# Patient Record
Sex: Female | Born: 1950 | Race: Black or African American | Hispanic: No | Smoking: Current every day smoker
Health system: Southern US, Community
[De-identification: ages and names within clinical notes are randomized; demographics above are authoritative.]

## PROBLEM LIST (undated history)

## (undated) DIAGNOSIS — K589 Irritable bowel syndrome without diarrhea: Secondary | ICD-10-CM

## (undated) DIAGNOSIS — G8929 Other chronic pain: Secondary | ICD-10-CM

## (undated) DIAGNOSIS — K219 Gastro-esophageal reflux disease without esophagitis: Secondary | ICD-10-CM

## (undated) DIAGNOSIS — J45909 Unspecified asthma, uncomplicated: Secondary | ICD-10-CM

## (undated) DIAGNOSIS — I509 Heart failure, unspecified: Secondary | ICD-10-CM

## (undated) DIAGNOSIS — K2970 Gastritis, unspecified, without bleeding: Secondary | ICD-10-CM

## (undated) DIAGNOSIS — E119 Type 2 diabetes mellitus without complications: Secondary | ICD-10-CM

## (undated) DIAGNOSIS — M1990 Unspecified osteoarthritis, unspecified site: Secondary | ICD-10-CM

## (undated) DIAGNOSIS — I10 Essential (primary) hypertension: Secondary | ICD-10-CM

## (undated) DIAGNOSIS — K449 Diaphragmatic hernia without obstruction or gangrene: Secondary | ICD-10-CM

## (undated) DIAGNOSIS — I219 Acute myocardial infarction, unspecified: Secondary | ICD-10-CM

## (undated) HISTORY — PX: COLON SURGERY: SHX602

## (undated) HISTORY — DX: Other chronic pain: G89.29

## (undated) HISTORY — PX: ANTERIOR AND POSTERIOR VAGINAL REPAIR: SUR5

## (undated) HISTORY — PX: KNEE SURGERY: SHX244

## (undated) HISTORY — DX: Essential (primary) hypertension: I10

## (undated) HISTORY — DX: Unspecified asthma, uncomplicated: J45.909

## (undated) HISTORY — PX: APPENDECTOMY: SHX54

## (undated) HISTORY — DX: Type 2 diabetes mellitus without complications: E11.9

## (undated) HISTORY — PX: FOOT SURGERY: SHX648

## (undated) HISTORY — PX: TUBAL LIGATION: SHX77

## (undated) HISTORY — DX: Heart failure, unspecified: I50.9

## (undated) HISTORY — PX: RIGHT COLECTOMY: SHX853

## (undated) HISTORY — DX: Unspecified osteoarthritis, unspecified site: M19.90

---

## 2008-02-25 ENCOUNTER — Emergency Department (HOSPITAL_COMMUNITY): Admission: EM | Admit: 2008-02-25 | Discharge: 2008-02-25 | Payer: Self-pay | Admitting: Emergency Medicine

## 2008-03-17 ENCOUNTER — Emergency Department (HOSPITAL_COMMUNITY): Admission: EM | Admit: 2008-03-17 | Discharge: 2008-03-18 | Payer: Self-pay | Admitting: Emergency Medicine

## 2008-03-30 ENCOUNTER — Emergency Department (HOSPITAL_COMMUNITY): Admission: EM | Admit: 2008-03-30 | Discharge: 2008-03-30 | Payer: Self-pay | Admitting: Emergency Medicine

## 2008-10-14 ENCOUNTER — Emergency Department (HOSPITAL_COMMUNITY): Admission: EM | Admit: 2008-10-14 | Discharge: 2008-10-14 | Payer: Self-pay | Admitting: Emergency Medicine

## 2009-05-01 ENCOUNTER — Emergency Department (HOSPITAL_COMMUNITY): Admission: EM | Admit: 2009-05-01 | Discharge: 2009-05-01 | Payer: Self-pay | Admitting: Family Medicine

## 2009-05-01 ENCOUNTER — Emergency Department (HOSPITAL_COMMUNITY): Admission: EM | Admit: 2009-05-01 | Discharge: 2009-05-01 | Payer: Self-pay | Admitting: Emergency Medicine

## 2010-02-21 ENCOUNTER — Emergency Department (HOSPITAL_COMMUNITY): Admission: EM | Admit: 2010-02-21 | Discharge: 2010-02-22 | Payer: Self-pay | Admitting: Emergency Medicine

## 2010-06-03 ENCOUNTER — Emergency Department (HOSPITAL_COMMUNITY): Admission: EM | Admit: 2010-06-03 | Discharge: 2010-06-03 | Payer: Self-pay | Admitting: Emergency Medicine

## 2011-02-04 LAB — DIFFERENTIAL
Basophils Absolute: 0 10*3/uL (ref 0.0–0.1)
Lymphocytes Relative: 41 % (ref 12–46)
Lymphs Abs: 2.6 10*3/uL (ref 0.7–4.0)
Monocytes Absolute: 0.5 10*3/uL (ref 0.1–1.0)
Monocytes Relative: 7 % (ref 3–12)
Neutro Abs: 3.1 10*3/uL (ref 1.7–7.7)

## 2011-02-04 LAB — COMPREHENSIVE METABOLIC PANEL
Alkaline Phosphatase: 60 U/L (ref 39–117)
BUN: 7 mg/dL (ref 6–23)
CO2: 27 mEq/L (ref 19–32)
Calcium: 9.6 mg/dL (ref 8.4–10.5)
Chloride: 106 mEq/L (ref 96–112)
GFR calc Af Amer: >60 mL/min (ref >60)
GFR calc non Af Amer: >60 mL/min (ref >60)
Glucose, Bld: 82 mg/dL (ref 70–99)
Potassium: 3.7 mEq/L (ref 3.5–5.1)
Total Bilirubin: 0.3 mg/dL (ref 0.3–1.2)
Total Protein: 7 g/dL (ref 6.0–8.3)

## 2011-02-04 LAB — URINALYSIS, ROUTINE W REFLEX MICROSCOPIC
Glucose, UA: NEGATIVE mg/dL
Ketones, ur: NEGATIVE mg/dL
Nitrite: NEGATIVE
Urobilinogen, UA: 0.2 mg/dL (ref 0.0–1.0)
pH: 6.5 (ref 5.0–8.0)

## 2011-02-04 LAB — CBC
Hemoglobin: 14.7 g/dL (ref 12.0–15.0)
RBC: 5.05 MIL/uL (ref 3.87–5.11)

## 2011-02-23 LAB — GLUCOSE, CAPILLARY: Glucose-Capillary: 118 mg/dL — ABNORMAL HIGH (ref 70–99)

## 2011-08-18 LAB — GLUCOSE, CAPILLARY: Glucose-Capillary: 125 — ABNORMAL HIGH

## 2011-08-21 ENCOUNTER — Emergency Department (HOSPITAL_COMMUNITY)
Admission: EM | Admit: 2011-08-21 | Discharge: 2011-08-21 | Disposition: A | Discharge status: Home / Self Care | Payer: Medicaid - Out of State | Attending: Emergency Medicine | Admitting: Emergency Medicine

## 2011-08-21 ENCOUNTER — Emergency Department (HOSPITAL_COMMUNITY): Payer: Medicaid - Out of State

## 2011-08-21 DIAGNOSIS — K7689 Other specified diseases of liver: Secondary | ICD-10-CM | POA: Insufficient documentation

## 2011-08-21 DIAGNOSIS — R109 Unspecified abdominal pain: Secondary | ICD-10-CM | POA: Insufficient documentation

## 2011-08-21 DIAGNOSIS — E119 Type 2 diabetes mellitus without complications: Secondary | ICD-10-CM | POA: Insufficient documentation

## 2011-08-21 DIAGNOSIS — F17200 Nicotine dependence, unspecified, uncomplicated: Secondary | ICD-10-CM | POA: Insufficient documentation

## 2011-08-21 DIAGNOSIS — Z9889 Other specified postprocedural states: Secondary | ICD-10-CM | POA: Insufficient documentation

## 2011-08-21 DIAGNOSIS — R10819 Abdominal tenderness, unspecified site: Secondary | ICD-10-CM | POA: Insufficient documentation

## 2011-08-21 DIAGNOSIS — I509 Heart failure, unspecified: Secondary | ICD-10-CM | POA: Insufficient documentation

## 2011-08-21 DIAGNOSIS — Z79899 Other long term (current) drug therapy: Secondary | ICD-10-CM | POA: Insufficient documentation

## 2011-08-21 LAB — CBC
HCT: 42 % (ref 36.0–46.0)
MCV: 85.2 fL (ref 78.0–100.0)
Platelets: 186 10*3/uL (ref 150–400)
RBC: 4.93 MIL/uL (ref 3.87–5.11)
RDW: 14.5 % (ref 11.5–15.5)

## 2011-08-21 LAB — URINALYSIS, ROUTINE W REFLEX MICROSCOPIC
Bilirubin Urine: NEGATIVE
Glucose, UA: NEGATIVE mg/dL
Hgb urine dipstick: NEGATIVE
Leukocytes, UA: NEGATIVE
Urobilinogen, UA: 1 mg/dL (ref 0.0–1.0)

## 2011-08-21 LAB — COMPREHENSIVE METABOLIC PANEL
ALT: 20 U/L (ref 0–35)
AST: 27 U/L (ref 0–37)
Albumin: 3.4 g/dL — ABNORMAL LOW (ref 3.5–5.2)
Alkaline Phosphatase: 57 U/L (ref 39–117)
BUN: 5 mg/dL — ABNORMAL LOW (ref 6–23)
CO2: 27 mEq/L (ref 19–32)
Calcium: 9.1 mg/dL (ref 8.4–10.5)
GFR calc Af Amer: >90 mL/min (ref >90)
Potassium: 3.1 mEq/L — ABNORMAL LOW (ref 3.5–5.1)
Total Bilirubin: 0.4 mg/dL (ref 0.3–1.2)
Total Protein: 6.7 g/dL (ref 6.0–8.3)

## 2011-08-21 LAB — DIFFERENTIAL
Basophils Relative: 0 % (ref 0–1)
Eosinophils Absolute: 0.1 10*3/uL (ref 0.0–0.7)
Monocytes Absolute: 0.5 10*3/uL (ref 0.1–1.0)
Neutro Abs: 1.4 10*3/uL — ABNORMAL LOW (ref 1.7–7.7)
Neutrophils Relative %: 32 % — ABNORMAL LOW (ref 43–77)

## 2011-08-21 LAB — LIPASE, BLOOD: Lipase: 24 U/L (ref 11–59)

## 2011-08-21 MED ORDER — IOHEXOL 300 MG/ML  SOLN
100.0000 mL | Freq: Once | INTRAMUSCULAR | Status: AC | PRN
  Administered 2011-08-21: 100 mL via INTRAVENOUS

## 2011-11-10 ENCOUNTER — Encounter: Payer: Self-pay | Admitting: *Deleted

## 2011-11-10 ENCOUNTER — Emergency Department (HOSPITAL_COMMUNITY): Payer: Medicaid - Out of State

## 2011-11-10 ENCOUNTER — Emergency Department (HOSPITAL_COMMUNITY)
Admission: EM | Admit: 2011-11-10 | Discharge: 2011-11-10 | Disposition: A | Discharge status: Home / Self Care | Payer: Medicaid - Out of State | Attending: Emergency Medicine | Admitting: Emergency Medicine

## 2011-11-10 DIAGNOSIS — J189 Pneumonia, unspecified organism: Secondary | ICD-10-CM | POA: Insufficient documentation

## 2011-11-10 DIAGNOSIS — R509 Fever, unspecified: Secondary | ICD-10-CM | POA: Insufficient documentation

## 2011-11-10 DIAGNOSIS — R0602 Shortness of breath: Secondary | ICD-10-CM | POA: Insufficient documentation

## 2011-11-10 DIAGNOSIS — R51 Headache: Secondary | ICD-10-CM | POA: Insufficient documentation

## 2011-11-10 DIAGNOSIS — R062 Wheezing: Secondary | ICD-10-CM | POA: Insufficient documentation

## 2011-11-10 HISTORY — DX: Diaphragmatic hernia without obstruction or gangrene: K44.9

## 2011-11-10 MED ORDER — ALBUTEROL SULFATE (5 MG/ML) 0.5% IN NEBU
5.0000 mg | INHALATION_SOLUTION | Freq: Once | RESPIRATORY_TRACT | Status: AC
  Administered 2011-11-10: 5 mg via RESPIRATORY_TRACT
  Filled 2011-11-10: qty 1

## 2011-11-10 MED ORDER — IPRATROPIUM BROMIDE 0.02 % IN SOLN
0.5000 mg | Freq: Once | RESPIRATORY_TRACT | Status: AC
  Administered 2011-11-10: 0.5 mg via RESPIRATORY_TRACT
  Filled 2011-11-10: qty 2.5

## 2011-11-10 MED ORDER — ONDANSETRON HCL 4 MG PO TABS: 4.0000 mg | ORAL_TABLET | Freq: Four times a day (QID) | ORAL | Status: DC

## 2011-11-10 MED ORDER — MOXIFLOXACIN HCL 400 MG PO TABS: 400.0000 mg | ORAL_TABLET | Freq: Every day | ORAL | Status: DC

## 2011-11-10 MED ORDER — OXYCODONE HCL 5 MG PO TABA: 1.0000 | ORAL_TABLET | Freq: Two times a day (BID) | ORAL | Status: DC | PRN

## 2011-11-10 NOTE — ED Provider Notes (Signed)
Medical screening examination/treatment/procedure(s) were performed by non-physician practitioner and as supervising physician I was immediately available for consultation/collaboration.  Toy Baker, MD 11/10/11 [phone]

## 2011-11-10 NOTE — ED Notes (Signed)
Pt reports having irritating fumes in apartment last few days and has had shob, asthma related beginning 2 days ago. Pt also reports HA.

## 2011-11-10 NOTE — ED Provider Notes (Signed)
History     CSN: [id number]  Arrival date & time 11/10/11  1317   First MD Initiated Contact with Patient 11/10/11 1336      Chief Complaint  Patient presents with  . Asthma  . Headache    (Consider location/radiation/quality/duration/timing/severity/associated sxs/prior treatment) HPI  Pt presents to the ED from her friends house for complaints of cough, headache and wheezing. Pt is here from [place] and has a primary care doctor name Dr. Beverly Sessions. The patient had an unknown surgery on her abdomen and was given Doxycline before coming to [place]. The patient states that Doxy gives her headaches, which she is currently complaining of, therefore she is not taking it anymore. The patient upon my entering to the room is coughing but not in respiratory distress.  Past Medical History  Diagnosis Date  . Asthma   . Hiatal hernia   . Diabetes mellitus     Past Surgical History  Procedure Date  . Right colectomy     No family history on file.  History  Substance Use Topics  . Smoking status: Current Everyday Smoker  . Smokeless tobacco: Not on file  . Alcohol Use: No    OB History    Grav Para Term Preterm Abortions TAB SAB Ect Mult Living                  Review of Systems  Allergies  Acetaminophen; Aspirin; Azithromycin; Benadryl; Dilaudid; Doxycycline; Fentanyl; Glimepiride; Glipizide; Levaquin; Meloxicam; Metformin and related; Morphine and related; Motrin; Penicillins; Prednisone; Shellfish allergy; Sulfa antibiotics; Tetracyclines & related; Vicodin; and Vioxx  Home Medications   Current Outpatient Rx  Name Route Sig Dispense Refill  . ALBUTEROL SULFATE HFA 108 (90 BASE) MCG/ACT IN AERS Inhalation Inhale 2 puffs into the lungs every 6 (six) hours as needed. For shortness of breath     . FLUTICASONE FUROATE 27.5 MCG/SPRAY NA SUSP Nasal Place 2 sprays into the nose daily.      Marland Kitchen MOXIFLOXACIN HCL 400 MG PO TABS Oral Take 1 tablet (400 mg total) by mouth daily. 10  tablet 0  . ONDANSETRON HCL 4 MG PO TABS Oral Take 1 tablet (4 mg total) by mouth every 6 (six) hours. 12 tablet 0  . OXYCODONE HCL (ABUSE DETER) 5 MG PO TABS Oral Take 1 tablet by mouth every 12 (twelve) hours as needed. 10 tablet 0    BP 147/70  Pulse 91  Temp(Src) 100.2 F (37.9 C) (Oral)  Resp 18  SpO2 99%  Physical Exam  Nursing note and vitals reviewed. Constitutional: She is oriented to person, place, and time. She appears well-developed and well-nourished.  HENT:  Head: Normocephalic and atraumatic.  Right Ear: External ear normal.  Left Ear: External ear normal.  Eyes: Conjunctivae are normal. Pupils are equal, round, and reactive to light.  Neck: Trachea normal, normal range of motion and full passive range of motion without pain. Neck supple.  Cardiovascular: Normal rate, regular rhythm and normal pulses.   Pulmonary/Chest: Effort normal. No respiratory distress. She has wheezes. She has rales. Chest wall is not dull to percussion. She exhibits no tenderness, no crepitus, no edema, no deformity and no retraction.  Abdominal: Soft. Normal appearance and bowel sounds are normal. There is no tenderness. There is no guarding.  Musculoskeletal: Normal range of motion.  Neurological: She is alert and oriented to person, place, and time. She has normal strength.  Skin: Skin is warm, dry and intact.  Psychiatric: She has a normal  mood and affect. Her speech is normal and behavior is normal. Judgment and thought content normal. Cognition and memory are normal.    ED Course  Procedures (including critical care time)  Labs Reviewed - No data to display Dg Chest 2 View  11/10/2011  *RADIOLOGY REPORT*  Clinical Data: Fever.  Cough.  Shortness of breath.  Chest pain.  CHEST - 2 VIEW  Comparison: 03/18/2008  Findings: Mild infiltrate is seen in the inferior right upper lobe, consistent with pneumonia.  Left lung is clear.  No evidence of pleural effusion.  Heart size is normal.   IMPRESSION: Mild inferior right upper lobe infiltrate, consistent with pneumonia.  Original Report Authenticated By: Danae Orleans, M.D.     1. Pneumonia       MDM  Pt is healthy, young, not tachypnic,tachycardic , is saturating well. She does have low grade fever. Pt chest xray positive for pneumonia. Pt has many medication allergies. She will be started on Avelox.        Dorthula Matas, PA 11/10/11 [phone]

## 2011-11-11 ENCOUNTER — Emergency Department (HOSPITAL_COMMUNITY)
Admission: EM | Admit: 2011-11-11 | Discharge: 2011-11-11 | Disposition: A | Discharge status: Home / Self Care | Payer: Medicaid - Out of State | Attending: Emergency Medicine | Admitting: Emergency Medicine

## 2011-11-11 ENCOUNTER — Encounter (HOSPITAL_COMMUNITY): Payer: Self-pay | Admitting: Emergency Medicine

## 2011-11-11 DIAGNOSIS — R062 Wheezing: Secondary | ICD-10-CM | POA: Insufficient documentation

## 2011-11-11 DIAGNOSIS — J189 Pneumonia, unspecified organism: Secondary | ICD-10-CM | POA: Insufficient documentation

## 2011-11-11 DIAGNOSIS — R0602 Shortness of breath: Secondary | ICD-10-CM | POA: Insufficient documentation

## 2011-11-11 MED ORDER — PANTOPRAZOLE SODIUM 40 MG PO TBEC
40.0000 mg | DELAYED_RELEASE_TABLET | Freq: Once | ORAL | Status: AC
  Administered 2011-11-11: 40 mg via ORAL
  Filled 2011-11-11: qty 1

## 2011-11-11 MED ORDER — ALBUTEROL SULFATE (5 MG/ML) 0.5% IN NEBU
5.0000 mg | INHALATION_SOLUTION | Freq: Once | RESPIRATORY_TRACT | Status: AC
  Administered 2011-11-11: 5 mg via RESPIRATORY_TRACT
  Filled 2011-11-11: qty 1

## 2011-11-11 MED ORDER — IPRATROPIUM BROMIDE 0.02 % IN SOLN
0.5000 mg | Freq: Once | RESPIRATORY_TRACT | Status: AC
  Administered 2011-11-11: 0.5 mg via RESPIRATORY_TRACT
  Filled 2011-11-11: qty 2.5

## 2011-11-11 MED ORDER — MOXIFLOXACIN HCL 400 MG PO TABS
400.0000 mg | ORAL_TABLET | Freq: Once | ORAL | Status: AC
  Administered 2011-11-11: 400 mg via ORAL
  Filled 2011-11-11: qty 1

## 2011-11-11 NOTE — ED Notes (Signed)
Pt states she was seen her yesterday and is unable to afford her medication. Pt states she needs to be seen for medication and flu

## 2011-11-11 NOTE — ED Provider Notes (Signed)
History     CSN: [id number]  Arrival date & time 11/11/11  1503   First MD Initiated Contact with Patient 11/11/11 1746      Chief Complaint  Patient presents with  . Medication Refill  . Influenza    (Consider location/radiation/quality/duration/timing/severity/associated sxs/prior treatment) HPI Comments: Patient was seen in the ED yesterday and was diagnosed with Pneumonia.  She was given a prescription for Avelox.  Patient is back today stating that she could not afford her prescription and could not get it filled.  She reports that she has had a productive cough for one and a half weeks, fever this morning, and shortness of breath.  She has been using Albuterol inhaler which she states helps with the shortness of breath.  PMH significant for asthma.  She currently smokes 1ppd.  Patient is a 60 y.o. female presenting with flu symptoms. The history is provided by the patient.  Influenza Associated symptoms include chills, congestion, fatigue, a fever and myalgias. Pertinent negatives include no chest pain, nausea, sore throat or vomiting.    Past Medical History  Diagnosis Date  . Asthma   . Hiatal hernia   . Diabetes mellitus     Past Surgical History  Procedure Date  . Right colectomy     No family history on file.  History  Substance Use Topics  . Smoking status: Current Everyday Smoker  . Smokeless tobacco: Not on file  . Alcohol Use: No    OB History    Grav Para Term Preterm Abortions TAB SAB Ect Mult Living                  Review of Systems  Constitutional: Positive for fever, chills and fatigue.  HENT: Positive for congestion. Negative for ear pain, sore throat and sinus pressure.   Respiratory: Positive for shortness of breath and wheezing. Negative for chest tightness.   Cardiovascular: Negative for chest pain.  Gastrointestinal: Negative for nausea and vomiting.  Musculoskeletal: Positive for myalgias.  Neurological: Negative for dizziness,  syncope and light-headedness.    Allergies  Acetaminophen; Aspirin; Azithromycin; Benadryl; Dilaudid; Doxycycline; Fentanyl; Glimepiride; Glipizide; Levaquin; Meloxicam; Metformin and related; Morphine and related; Motrin; Penicillins; Prednisone; Shellfish allergy; Sulfa antibiotics; Tetracyclines & related; Vicodin; and Vioxx  Home Medications   Current Outpatient Rx  Name Route Sig Dispense Refill  . ALBUTEROL SULFATE HFA 108 (90 BASE) MCG/ACT IN AERS Inhalation Inhale 2 puffs into the lungs every 6 (six) hours as needed. For shortness of breath     . FLUTICASONE FUROATE 27.5 MCG/SPRAY NA SUSP Nasal Place 2 sprays into the nose daily.        BP 125/77  Pulse 88  Temp 99.6 F (37.6 C)  Resp 22  SpO2 96%  Physical Exam  Constitutional: She is oriented to person, place, and time. She appears well-developed and well-nourished. No distress.  HENT:  Head: Normocephalic and atraumatic.  Right Ear: Tympanic membrane and ear canal normal.  Left Ear: Tympanic membrane and ear canal normal.  Nose: Nose normal.  Mouth/Throat: Uvula is midline, oropharynx is clear and moist and mucous membranes are normal.  Neck: Normal range of motion. Neck supple.  Cardiovascular: Normal rate, regular rhythm and normal heart sounds.   Pulmonary/Chest: Effort normal. No respiratory distress. She has wheezes.  Neurological: She is alert and oriented to person, place, and time.  Skin: Skin is warm and dry. No rash noted. She is not diaphoretic.  Psychiatric: She has a normal mood and  affect.    ED Course  Procedures (including critical care time)  Labs Reviewed - No data to display Dg Chest 2 View  11/10/2011  *RADIOLOGY REPORT*  Clinical Data: Fever.  Cough.  Shortness of breath.  Chest pain.  CHEST - 2 VIEW  Comparison: 03/18/2008  Findings: Mild infiltrate is seen in the inferior right upper lobe, consistent with pneumonia.  Left lung is clear.  No evidence of pleural effusion.  Heart size is  normal.  IMPRESSION: Mild inferior right upper lobe infiltrate, consistent with pneumonia.  Original Report Authenticated By: Danae Orleans, M.D.     No diagnosis found.  Patient given a breathing treatment.  Lungs CTAB, no wheezing after treatment.  Social work was consulted to help her get her medication filled.  Social work was able to find a pharmacy that the patient could go to to get Avelox that would be 265 dollars for her treatment.  She was unable to get any free antibiotics because she has insurance.  Patient was put on the Avelox because of her long list of allergies.  I went back to the room and asked patient about her allergies in an attempt to find a cheaper antibiotic.  Patient reports that she does not want any antibiotic that will upset her stomach and that she wants to keep her Avelox prescription.  She states that she will find a way to pay for it.  MDM  Patient diagnosed with Pneumonia yesterday.  She decided to continue current antibiotic.  Patient in no acute respiratory distress.        Pascal Lux [facility] 11/14/11 2020

## 2011-11-16 NOTE — ED Provider Notes (Signed)
Medical screening examination/treatment/procedure(s) were performed by non-physician practitioner and as supervising physician I was immediately available for consultation/collaboration.   Tsuyako Jolley, MD 11/16/11 1247 

## 2011-12-10 ENCOUNTER — Other Ambulatory Visit: Payer: Self-pay

## 2011-12-10 ENCOUNTER — Emergency Department (HOSPITAL_COMMUNITY)
Admission: EM | Admit: 2011-12-10 | Discharge: 2011-12-10 | Disposition: A | Discharge status: Home / Self Care | Payer: Medicaid - Out of State | Attending: Emergency Medicine | Admitting: Emergency Medicine

## 2011-12-10 ENCOUNTER — Encounter (HOSPITAL_COMMUNITY): Payer: Self-pay | Admitting: *Deleted

## 2011-12-10 ENCOUNTER — Emergency Department (HOSPITAL_COMMUNITY): Payer: Medicaid - Out of State

## 2011-12-10 DIAGNOSIS — E119 Type 2 diabetes mellitus without complications: Secondary | ICD-10-CM | POA: Insufficient documentation

## 2011-12-10 DIAGNOSIS — R112 Nausea with vomiting, unspecified: Secondary | ICD-10-CM | POA: Insufficient documentation

## 2011-12-10 DIAGNOSIS — H6091 Unspecified otitis externa, right ear: Secondary | ICD-10-CM

## 2011-12-10 DIAGNOSIS — R1013 Epigastric pain: Secondary | ICD-10-CM | POA: Insufficient documentation

## 2011-12-10 DIAGNOSIS — H60399 Other infective otitis externa, unspecified ear: Secondary | ICD-10-CM | POA: Insufficient documentation

## 2011-12-10 DIAGNOSIS — I252 Old myocardial infarction: Secondary | ICD-10-CM | POA: Insufficient documentation

## 2011-12-10 DIAGNOSIS — F411 Generalized anxiety disorder: Secondary | ICD-10-CM | POA: Insufficient documentation

## 2011-12-10 HISTORY — DX: Irritable bowel syndrome, unspecified: K58.9

## 2011-12-10 HISTORY — DX: Gastro-esophageal reflux disease without esophagitis: K21.9

## 2011-12-10 HISTORY — DX: Acute myocardial infarction, unspecified: I21.9

## 2011-12-10 HISTORY — DX: Gastritis, unspecified, without bleeding: K29.70

## 2011-12-10 LAB — LIPASE, BLOOD: Lipase: 24 U/L (ref 11–59)

## 2011-12-10 LAB — COMPREHENSIVE METABOLIC PANEL
Alkaline Phosphatase: 82 U/L (ref 39–117)
BUN: 6 mg/dL (ref 6–23)
CO2: 26 mEq/L (ref 19–32)
Chloride: 104 mEq/L (ref 96–112)
GFR calc Af Amer: >90 mL/min (ref >90)
GFR calc non Af Amer: >90 mL/min (ref >90)
Glucose, Bld: 133 mg/dL — ABNORMAL HIGH (ref 70–99)
Potassium: 3.7 mEq/L (ref 3.5–5.1)
Total Bilirubin: 0.5 mg/dL (ref 0.3–1.2)
Total Protein: 8.1 g/dL (ref 6.0–8.3)

## 2011-12-10 LAB — URINALYSIS, ROUTINE W REFLEX MICROSCOPIC
Bilirubin Urine: NEGATIVE
Ketones, ur: NEGATIVE mg/dL
Nitrite: NEGATIVE
Urobilinogen, UA: 1 mg/dL (ref 0.0–1.0)

## 2011-12-10 LAB — CBC
HCT: 44.8 % (ref 36.0–46.0)
Hemoglobin: 15.3 g/dL — ABNORMAL HIGH (ref 12.0–15.0)
MCHC: 34.2 g/dL (ref 30.0–36.0)
RBC: 5.41 MIL/uL — ABNORMAL HIGH (ref 3.87–5.11)

## 2011-12-10 LAB — POCT I-STAT TROPONIN I: Troponin i, poc: 0 ng/mL (ref 0.00–0.08)

## 2011-12-10 MED ORDER — SUCRALFATE 1 G PO TABS: 1.0000 g | ORAL_TABLET | Freq: Three times a day (TID) | ORAL | Status: DC

## 2011-12-10 MED ORDER — SUCRALFATE 1 G PO TABS
1.0000 g | ORAL_TABLET | Freq: Once | ORAL | Status: AC
  Administered 2011-12-10: 1 g via ORAL
  Filled 2011-12-10: qty 1

## 2011-12-10 MED ORDER — NEOMYCIN-POLYMYXIN-HC 3.5-10000-1 OT SOLN
4.0000 [drp] | Freq: Four times a day (QID) | OTIC | Status: DC
  Administered 2011-12-10: 4 [drp] via OTIC
  Filled 2011-12-10: qty 10

## 2011-12-10 MED ORDER — GI COCKTAIL ~~LOC~~
30.0000 mL | Freq: Once | ORAL | Status: AC
  Administered 2011-12-10: 30 mL via ORAL
  Filled 2011-12-10: qty 30

## 2011-12-10 NOTE — ED Provider Notes (Signed)
History     CSN: [id number]  Arrival date & time 12/10/11  0757   First MD Initiated Contact with Patient 12/10/11 0813      No chief complaint on file.    The history is provided by the patient.   the patient is a 61 year old female with a past medical history significant for an MI, diabetes, reflux, and recent colon resection in 06/2012 for a suspicious polyp who presents with burning epigastric pain present since she woke up yesterday morning. The pain is severe, constant, radiates up to the throat. It is associated with nausea and vomiting, as well as excessive belching. There has been no associated fever, chills, shortness of breath, arm pain abdominal pain, dysuria or change in bowel habit. The patient reports that she ate barbecued pork chops and spaghetti prior to the onset of symptoms. She has taken her Prilosec with minimal change in symptoms. There are no aggravating factors.  Past Medical History  Diagnosis Date  . Asthma   . Hiatal hernia   . Diabetes mellitus   . MI (myocardial infarction)   . GERD (gastroesophageal reflux disease)   . IBS (irritable bowel syndrome)   . Gastritis     Past Surgical History  Procedure Date  . Right colectomy     No family history on file.  History  Substance Use Topics  . Smoking status: Current Everyday Smoker  . Smokeless tobacco: Not on file  . Alcohol Use: No     Review of Systems 10 systems reviewed and are negative for acute change except as noted in the HPI.  Allergies  Acetaminophen; Aspirin; Azithromycin; Benadryl; Dilaudid; Doxycycline; Fentanyl; Glimepiride; Glipizide; Levaquin; Meloxicam; Metformin and related; Morphine and related; Motrin; Penicillins; Prednisone; Shellfish allergy; Sulfa antibiotics; Tetracyclines & related; Vicodin; and Vioxx  Home Medications   Current Outpatient Rx  Name Route Sig Dispense Refill  . ALBUTEROL SULFATE HFA 108 (90 BASE) MCG/ACT IN AERS Inhalation Inhale 2 puffs into the  lungs every 6 (six) hours as needed. For shortness of breath     . FLUTICASONE FUROATE 27.5 MCG/SPRAY NA SUSP Nasal Place 2 sprays into the nose daily.        BP 153/72  Pulse 65  Temp(Src) 99.1 F (37.3 C) (Oral)  Resp 18  Ht 5\' 7"  (1.702 m)  Wt 239 lb (108.41 kg)  BMI 37.43 kg/m2  SpO2 98%  Physical Exam  Nursing note and vitals reviewed. Constitutional: She is oriented to person, place, and time. She appears well-developed and well-nourished. She appears distressed.  HENT:  Head: Normocephalic and atraumatic.  Right Ear: Tympanic membrane and external ear normal.  Left Ear: Tympanic membrane and external ear normal.  Mouth/Throat: Oropharynx is clear and moist.       Erythema to right ear canal with pain on movement of the tragus.  Eyes: Conjunctivae are normal. Pupils are equal, round, and reactive to light.  Neck: Normal range of motion. Neck supple. No JVD present.  Cardiovascular: Normal rate, regular rhythm and normal heart sounds.   No murmur heard. Pulmonary/Chest: Effort normal and breath sounds normal. No respiratory distress. She has no wheezes. She exhibits no tenderness.  Abdominal: Soft. Bowel sounds are normal. She exhibits no distension and no mass. There is no rebound and no guarding.       Obese. Mild epigastric TTP  Musculoskeletal: She exhibits no edema and no tenderness.  Lymphadenopathy:    She has no cervical adenopathy.  Neurological: She is alert and  oriented to person, place, and time. No cranial nerve deficit.  Skin: Skin is warm and dry. No erythema.  Psychiatric:       Anxious    ED Course  Procedures (including critical care time)  Labs Reviewed  CBC - Abnormal; Notable for the following:    RBC 5.41 (*)    Hemoglobin 15.3 (*)    All other components within normal limits  COMPREHENSIVE METABOLIC PANEL - Abnormal; Notable for the following:    Glucose, Bld 133 (*)    All other components within normal limits  LIPASE, BLOOD  URINALYSIS,  ROUTINE W REFLEX MICROSCOPIC  POCT I-STAT TROPONIN I  I-STAT TROPONIN I   Dg Abd Acute W/chest  12/10/2011  *RADIOLOGY REPORT*  Clinical Data: Nausea, vomiting for 2 days, epigastric pain  ACUTE ABDOMEN SERIES (ABDOMEN 2 VIEW & CHEST 1 VIEW)  Comparison: CT abdomen pelvis of 08/21/2011 and portable chest x- ray of 03/18/2008  Findings: The lungs are clear.  Mediastinal contours appear stable. The heart is within normal limits in size.  No bony abnormality is seen.  Supine and erect views of the abdomen show no bowel obstruction. No free air is seen.  Calcifications along the right paravertebral region by plain film correlate with calcified lymph nodes when compared to the prior CT.  Surgical sutures are noted in the right abdomen.  IMPRESSION:  1.  No active lung disease. 2.  No bowel obstruction.  No free air.  Original Report Authenticated By: Juline Patch, M.D.    Date: 12/10/2011  Rate: 57  Rhythm: sinus  QRS Axis: normal  Intervals: normal  ST/T Wave abnormalities: normal  Conduction Disutrbances:none  Narrative Interpretation:   Old EKG Reviewed: unchanged    1. Epigastric pain   2. Otitis externa of right ear       MDM  8:40 AM Pt has been seen and evaluated. Symptoms most c/w reflux but atypical ACS cannot be ruled out clinically. ECG and Troponin ordered. Additional laboratory studies ordered to eval biliary or pancreatic pathology.  Will wait for ASA administration given low ACS suspicion and pt hx of intolerance.    9:00 AM GI cocktail ordered to attempt symptomatic relief.   11:30 AM All labs and x-rays have been reviewed. ECG unremarkable. Some relief with GI cocktail, but pt c/o continued symptoms. Carafate ordered. Pt also c/o ear itching. Exam c/w otitis externa. Abx drops ordered.           12:43 PM Pt reports significant symptom relief after carafate. Requests Rx for home.  Elwyn Reach [place], [place] 12/10/11 1243

## 2011-12-10 NOTE — ED Notes (Signed)
Pt states she had colon surgery in aug. Pt states she just started to have an appetite and started to eat a lot foods that are not good for her. Pt states she ate pork chops and drunk orange soda.pt states since she has started to feel heartburn and have some nausea.

## 2011-12-10 NOTE — ED Notes (Signed)
Pt upset she is still in ER.  Told pt I would see if doctor could speak with her.

## 2011-12-10 NOTE — ED Notes (Signed)
MD at bedside. 

## 2011-12-11 NOTE — ED Provider Notes (Signed)
Medical screening examination/treatment/procedure(s) were performed by non-physician practitioner and as supervising physician I was immediately available for consultation/collaboration.  Nhu Glasby, MD 12/11/11 0705 

## 2011-12-14 ENCOUNTER — Emergency Department (HOSPITAL_COMMUNITY)
Admission: EM | Admit: 2011-12-14 | Discharge: 2011-12-14 | Disposition: A | Discharge status: Home / Self Care | Payer: Medicaid - Out of State | Attending: Emergency Medicine | Admitting: Emergency Medicine

## 2011-12-14 ENCOUNTER — Encounter (HOSPITAL_COMMUNITY): Payer: Self-pay | Admitting: *Deleted

## 2011-12-14 DIAGNOSIS — I252 Old myocardial infarction: Secondary | ICD-10-CM | POA: Insufficient documentation

## 2011-12-14 DIAGNOSIS — L509 Urticaria, unspecified: Secondary | ICD-10-CM | POA: Insufficient documentation

## 2011-12-14 DIAGNOSIS — R21 Rash and other nonspecific skin eruption: Secondary | ICD-10-CM | POA: Insufficient documentation

## 2011-12-14 MED ORDER — FAMOTIDINE 20 MG PO TABS: 20.0000 mg | ORAL_TABLET | Freq: Two times a day (BID) | ORAL | Status: DC

## 2011-12-14 MED ORDER — FAMOTIDINE 20 MG PO TABS
20.0000 mg | ORAL_TABLET | Freq: Once | ORAL | Status: AC
  Administered 2011-12-14: 20 mg via ORAL
  Filled 2011-12-14: qty 1

## 2011-12-14 MED ORDER — DIPHENHYDRAMINE HCL 25 MG PO TABS: 25.0000 mg | ORAL_TABLET | Freq: Four times a day (QID) | ORAL | Status: AC

## 2011-12-14 MED ORDER — DIPHENHYDRAMINE HCL 25 MG PO CAPS
25.0000 mg | ORAL_CAPSULE | Freq: Once | ORAL | Status: AC
  Administered 2011-12-14: 25 mg via ORAL
  Filled 2011-12-14: qty 1

## 2011-12-14 MED ORDER — PREDNISONE 20 MG PO TABS
60.0000 mg | ORAL_TABLET | Freq: Once | ORAL | Status: AC
  Administered 2011-12-14: 60 mg via ORAL
  Filled 2011-12-14: qty 3

## 2011-12-14 NOTE — ED Notes (Signed)
Pt noted to be ambulating around the room. Pt went to bathroom with her purse. Pt is alert and oriented

## 2011-12-14 NOTE — ED Notes (Signed)
Pt states she took benadryl with other abx. Pt states she does not think she is allergic to benadryl

## 2011-12-14 NOTE — ED Notes (Signed)
Pt states she started to have itching on her finger and toe nails. Pt states she every time she takes her abx she breaks out in hives. Pt is alert and oriented. Pt does not appear to be in resp distress

## 2011-12-14 NOTE — ED Provider Notes (Signed)
History     CSN: [id number]  Arrival date & time 12/14/11  1020   First MD Initiated Contact with Patient 12/14/11 1029      No chief complaint on file.   (Consider location/radiation/quality/duration/timing/severity/associated sxs/prior treatment) Patient is a 61 y.o. female presenting with allergic reaction. The history is provided by the patient.  Allergic Reaction The primary symptoms are  rash and urticaria. The primary symptoms do not include wheezing, shortness of breath, cough, abdominal pain, nausea, vomiting, diarrhea, dizziness, palpitations or angioedema. The current episode started 13 to 24 hours ago. The problem has not changed since onset. The urticaria began 13 to 24 hours ago. The urticaria has been unchanged since its onset. Urticaria is located on the left foot, right foot, right hand, left hand, left arm and right arm.  The onset of the reaction was associated with a change in medication. Significant symptoms that are not present include eye redness.    Past Medical History  Diagnosis Date  . Asthma   . Hiatal hernia   . Diabetes mellitus   . MI (myocardial infarction)   . GERD (gastroesophageal reflux disease)   . IBS (irritable bowel syndrome)   . Gastritis     Past Surgical History  Procedure Date  . Right colectomy     No family history on file.  History  Substance Use Topics  . Smoking status: Current Everyday Smoker  . Smokeless tobacco: Not on file  . Alcohol Use: No    OB History    Grav Para Term Preterm Abortions TAB SAB Ect Mult Living                  Review of Systems  Constitutional: Negative for fever.  HENT: Negative for congestion and neck pain.   Eyes: Negative for redness.  Respiratory: Negative for cough, shortness of breath and wheezing.   Cardiovascular: Negative for palpitations.  Gastrointestinal: Negative for nausea, vomiting, abdominal pain and diarrhea.  Genitourinary: Negative for dysuria.  Musculoskeletal:  Negative for back pain.  Skin: Positive for rash.  Neurological: Negative for dizziness and headaches.  Hematological: Does not bruise/bleed easily.    Allergies  Acetaminophen; Aspirin; Azithromycin; Benadryl; Dairy aid; Dilaudid; Doxycycline; Fentanyl; Glimepiride; Glipizide; Levaquin; Meloxicam; Metformin and related; Morphine and related; Motrin; Penicillins; Prednisone; Shellfish allergy; Sulfa antibiotics; Tetracyclines & related; Vicodin; and Vioxx  Home Medications   Current Outpatient Rx  Name Route Sig Dispense Refill  . ALBUTEROL SULFATE HFA 108 (90 BASE) MCG/ACT IN AERS Inhalation Inhale 2 puffs into the lungs every 6 (six) hours as needed. For shortness of breath     . CALCIUM CARBONATE ANTACID 500 MG PO CHEW Oral Chew 2 tablets by mouth daily.    Marland Kitchen CLINDAMYCIN HCL 150 MG PO CAPS Oral Take 150 mg by mouth every 6 (six) hours. Take for 10 days.    Marland Kitchen FLUTICASONE FUROATE 27.5 MCG/SPRAY NA SUSP Nasal Place 2 sprays into the nose daily.      Marland Kitchen OMEPRAZOLE 20 MG PO CPDR Oral Take 20 mg by mouth daily.    Marland Kitchen RANITIDINE HCL 150 MG PO TABS Oral Take 150 mg by mouth 2 (two) times daily.    . SUCRALFATE 1 G PO TABS Oral Take 1 tablet (1 g total) by mouth 3 (three) times daily before meals. As needed 30 tablet 0  . DIPHENHYDRAMINE HCL 25 MG PO TABS Oral Take 1 tablet (25 mg total) by mouth every 6 (six) hours. 20 tablet 0  .  FAMOTIDINE 20 MG PO TABS Oral Take 1 tablet (20 mg total) by mouth 2 (two) times daily. 30 tablet 0    BP 122/69  Pulse 100  Temp(Src) 98.7 F (37.1 C) (Oral)  Resp 20  Ht 5\' 6"  (1.676 m)  Wt 240 lb (108.863 kg)  BMI 38.74 kg/m2  SpO2 97%  Physical Exam  Nursing note and vitals reviewed. Constitutional: She is oriented to person, place, and time. She appears well-developed and well-nourished. No distress.  HENT:  Head: Normocephalic and atraumatic.  Mouth/Throat: Oropharynx is clear and moist.  Eyes: Conjunctivae are normal. Pupils are equal, round, and  reactive to light.  Neck: Normal range of motion. Neck supple.  Cardiovascular: Normal rate, regular rhythm and normal heart sounds.   No murmur heard. Pulmonary/Chest: Effort normal and breath sounds normal. She has no wheezes.  Abdominal: Soft. Bowel sounds are normal. There is no tenderness.  Musculoskeletal: Normal range of motion.  Neurological: She is alert and oriented to person, place, and time. No cranial nerve deficit. She exhibits normal muscle tone. Coordination normal.  Skin: Rash noted.       Mild urticaria to bilateral forearms back of hands and tablet    ED Course  Procedures (including critical care time)  Labs Reviewed - No data to display No results found.   1. Hives       MDM   Patient seen frequently in the emergency department numerous times over the past 7 days. Currently no longer taking antibiotics was on 2 different antibiotics for pneumonia first it was Avelox and clindamycin. Patient presents with hives and itching of hands and feet no lip or tongue swelling no evidence of angioedema please related to the sucralfate. Will have patient stop that medication in the emergency department given 60 mg prednisone by mouth patient will not take a five-day course of prednisone. Patient doesn't like taking Benadryl does not have a true allergy but will agree to taking it for 24 hours given the Benadryl by mouth in the emergency department given prednisone 60 mg by mouth given Pepcid 20 mg by mouth will be sent home with a prescription for Pepcid to take for 7 days. This will substitute as well for the sucralfate.  The patient resident of [place] but has been in the [place] area now for a prolonged period of time resource guide provided so she can seek primary care provider here.        Shelda Jakes, MD 12/14/11 [phone]

## 2013-11-04 ENCOUNTER — Emergency Department (HOSPITAL_COMMUNITY): Payer: Medicaid Other

## 2013-11-04 ENCOUNTER — Emergency Department (HOSPITAL_COMMUNITY)
Admission: EM | Admit: 2013-11-04 | Discharge: 2013-11-04 | Disposition: A | Discharge status: Home / Self Care | Payer: Medicaid Other | Attending: Emergency Medicine | Admitting: Emergency Medicine

## 2013-11-04 ENCOUNTER — Encounter (HOSPITAL_COMMUNITY): Payer: Self-pay | Admitting: Emergency Medicine

## 2013-11-04 DIAGNOSIS — R110 Nausea: Secondary | ICD-10-CM

## 2013-11-04 DIAGNOSIS — Z794 Long term (current) use of insulin: Secondary | ICD-10-CM | POA: Insufficient documentation

## 2013-11-04 DIAGNOSIS — Z792 Long term (current) use of antibiotics: Secondary | ICD-10-CM | POA: Insufficient documentation

## 2013-11-04 DIAGNOSIS — K219 Gastro-esophageal reflux disease without esophagitis: Secondary | ICD-10-CM | POA: Insufficient documentation

## 2013-11-04 DIAGNOSIS — R42 Dizziness and giddiness: Secondary | ICD-10-CM | POA: Insufficient documentation

## 2013-11-04 DIAGNOSIS — N390 Urinary tract infection, site not specified: Secondary | ICD-10-CM | POA: Insufficient documentation

## 2013-11-04 DIAGNOSIS — R8271 Bacteriuria: Secondary | ICD-10-CM

## 2013-11-04 DIAGNOSIS — Z79899 Other long term (current) drug therapy: Secondary | ICD-10-CM | POA: Insufficient documentation

## 2013-11-04 DIAGNOSIS — Z880 Allergy status to penicillin: Secondary | ICD-10-CM | POA: Insufficient documentation

## 2013-11-04 DIAGNOSIS — R079 Chest pain, unspecified: Secondary | ICD-10-CM

## 2013-11-04 DIAGNOSIS — J45909 Unspecified asthma, uncomplicated: Secondary | ICD-10-CM | POA: Insufficient documentation

## 2013-11-04 DIAGNOSIS — E119 Type 2 diabetes mellitus without complications: Secondary | ICD-10-CM | POA: Insufficient documentation

## 2013-11-04 DIAGNOSIS — F17200 Nicotine dependence, unspecified, uncomplicated: Secondary | ICD-10-CM | POA: Insufficient documentation

## 2013-11-04 DIAGNOSIS — I252 Old myocardial infarction: Secondary | ICD-10-CM | POA: Insufficient documentation

## 2013-11-04 LAB — CBC WITH DIFFERENTIAL/PLATELET
Basophils Absolute: 0 10*3/uL (ref 0.0–0.1)
Basophils Relative: 0 % (ref 0–1)
Eosinophils Relative: 1 % (ref 0–5)
HCT: 43.2 % (ref 36.0–46.0)
Hemoglobin: 14.9 g/dL (ref 12.0–15.0)
Lymphocytes Relative: 43 % (ref 12–46)
MCHC: 34.5 g/dL (ref 30.0–36.0)
MCV: 84.9 fL (ref 78.0–100.0)
Monocytes Absolute: 0.4 10*3/uL (ref 0.1–1.0)
Monocytes Relative: 10 % (ref 3–12)
RDW: 13.6 % (ref 11.5–15.5)

## 2013-11-04 LAB — POCT I-STAT, CHEM 8
Chloride: 104 mEq/L (ref 96–112)
Creatinine, Ser: 0.7 mg/dL (ref 0.50–1.10)
Glucose, Bld: 188 mg/dL — ABNORMAL HIGH (ref 70–99)
HCT: 47 % — ABNORMAL HIGH (ref 36.0–46.0)
Potassium: 3.9 mEq/L (ref 3.5–5.1)
Sodium: 140 mEq/L (ref 135–145)

## 2013-11-04 LAB — URINALYSIS, ROUTINE W REFLEX MICROSCOPIC
Bilirubin Urine: NEGATIVE
Hgb urine dipstick: NEGATIVE
Ketones, ur: NEGATIVE mg/dL
Nitrite: NEGATIVE
pH: 6 (ref 5.0–8.0)

## 2013-11-04 LAB — POCT I-STAT TROPONIN I

## 2013-11-04 LAB — URINE MICROSCOPIC-ADD ON

## 2013-11-04 MED ORDER — ONDANSETRON HCL 4 MG/2ML IJ SOLN
4.0000 mg | Freq: Once | INTRAMUSCULAR | Status: AC
  Administered 2013-11-04: 4 mg via INTRAVENOUS
  Filled 2013-11-04: qty 2

## 2013-11-04 MED ORDER — OXYCODONE HCL 5 MG PO TABS: 5.0000 mg | ORAL_TABLET | ORAL | Status: DC | PRN

## 2013-11-04 MED ORDER — ONDANSETRON 8 MG PO TBDP: 8.0000 mg | ORAL_TABLET | Freq: Three times a day (TID) | ORAL | Status: DC | PRN

## 2013-11-04 MED ORDER — SODIUM CHLORIDE 0.9 % IV SOLN
INTRAVENOUS | Status: DC
  Administered 2013-11-04: 11:00:00 via INTRAVENOUS

## 2013-11-04 NOTE — ED Notes (Signed)
Patient will call when she has to urinate 

## 2013-11-04 NOTE — ED Notes (Signed)
Pt from home reports that she started taking Clindamycin yesterday for tooth infection, around 9 pm last night started to feel like her throat was blocked, then vomited. Pt states that she still feels dizzy this morning and her throat "feels swollen". Pt adds that she is also having burning in her stomach from abx. Pt is A&O and in NAD

## 2013-11-04 NOTE — ED Provider Notes (Signed)
CSN: [id number]     Arrival date & time 11/04/13  0920 History   First MD Initiated Contact with Patient 11/04/13 [phone]     Chief Complaint  Patient presents with  . Dizziness  . Headache  . Sore Throat   (Consider location/radiation/quality/duration/timing/severity/associated sxs/prior Treatment) Patient is a 62 y.o. female presenting with dizziness, headaches, and pharyngitis. The history is provided by the patient.  Dizziness Associated symptoms: headaches, nausea and vomiting   Associated symptoms: no chest pain, no diarrhea and no shortness of breath   Headache Associated symptoms: abdominal pain, dizziness, nausea, sore throat and vomiting   Associated symptoms: no back pain, no diarrhea, no ear pain, no pain, no neck pain, no neck stiffness and no numbness   Sore Throat Associated symptoms include abdominal pain and headaches. Pertinent negatives include no chest pain and no shortness of breath.   patient presents with multiple complaints. States she began to feel some chest pain and the feeling she was choking last night. She states she had some vomiting. She denies diarrhea. She states she felt as if something was making her oxygen go away. She also said a sore throat. She's had a throbbing headache. She's also had lower abdominal pain. She's had dental pain and has been on clindamycin" for her gums". She states she needs to see a oral Careers adviser. No fevers. Patient stated that she feels better and feels the same. No chest pain was dull. The headache is frontal and throbbing. No confusion. No lightheadedness. No dysuria.  Past Medical History  Diagnosis Date  . Asthma   . Hiatal hernia   . Diabetes mellitus   . MI (myocardial infarction)   . GERD (gastroesophageal reflux disease)   . IBS (irritable bowel syndrome)   . Gastritis    Past Surgical History  Procedure Laterality Date  . Right colectomy     No family history on file. History  Substance Use Topics  . Smoking  status: Current Every Day Smoker    Types: Cigarettes  . Smokeless tobacco: Not on file  . Alcohol Use: No   OB History   Grav Para Term Preterm Abortions TAB SAB Ect Mult Living                 Review of Systems  Constitutional: Negative for activity change and appetite change.  HENT: Positive for sore throat. Negative for ear pain.   Eyes: Negative for pain.  Respiratory: Negative for chest tightness and shortness of breath.   Cardiovascular: Negative for chest pain and leg swelling.  Gastrointestinal: Positive for nausea, vomiting and abdominal pain. Negative for diarrhea.  Genitourinary: Negative for flank pain.  Musculoskeletal: Negative for back pain, neck pain and neck stiffness.  Skin: Negative for rash.  Neurological: Positive for dizziness and headaches. Negative for weakness and numbness.  Psychiatric/Behavioral: Negative for behavioral problems.    Allergies  Acetaminophen; Aspirin; Azithromycin; Benadryl; Dairy aid; Dilaudid; Doxycycline; Fentanyl; Glimepiride; Glipizide; Levofloxacin; Meloxicam; Metformin and related; Morphine and related; Motrin; Penicillins; Prednisone; Shellfish allergy; Sulfa antibiotics; Tetracyclines & related; Vicodin; and Vioxx  Home Medications   Current Outpatient Rx  Name  Route  Sig  Dispense  Refill  . albuterol (PROVENTIL HFA;VENTOLIN HFA) 108 (90 BASE) MCG/ACT inhaler   Inhalation   Inhale 2 puffs into the lungs every 6 (six) hours as needed. For shortness of breath          . clindamycin (CLEOCIN) 150 MG capsule   Oral   Take 150  mg by mouth 3 (three) times daily.         Marland Kitchen gabapentin (NEURONTIN) 800 MG tablet   Oral   Take 800 mg by mouth 3 (three) times daily.         . insulin detemir (LEVEMIR) 100 UNIT/ML injection   Subcutaneous   Inject 18-24 Units into the skin at bedtime. Sliding scale         . lisinopril (PRINIVIL,ZESTRIL) 10 MG tablet   Oral   Take 10 mg by mouth daily.         . ranitidine (ZANTAC)  150 MG tablet   Oral   Take 150 mg by mouth 2 (two) times daily.         . simvastatin (ZOCOR) 40 MG tablet   Oral   Take 40 mg by mouth at bedtime.         . ondansetron (ZOFRAN-ODT) 8 MG disintegrating tablet   Oral   Take 1 tablet (8 mg total) by mouth every 8 (eight) hours as needed for nausea or vomiting.   10 tablet   0   . oxyCODONE (OXY IR/ROXICODONE) 5 MG immediate release tablet   Oral   Take 1 tablet (5 mg total) by mouth every 4 (four) hours as needed for moderate pain or severe pain.   6 tablet   0    BP 124/46  Pulse 56  Temp(Src) 98.5 F (36.9 C) (Oral)  Resp 16  SpO2 95% Physical Exam  Nursing note and vitals reviewed. Constitutional: She is oriented to person, place, and time. She appears well-developed and well-nourished.  HENT:  Head: Normocephalic and atraumatic.  Mouth/Throat: No oropharyngeal exudate.  Mild posterior pharyngeal erythema without exudate  Eyes: EOM are normal. Pupils are equal, round, and reactive to light.  Neck: Normal range of motion. Neck supple.  Cardiovascular: Normal rate, regular rhythm and normal heart sounds.   No murmur heard. Pulmonary/Chest: Effort normal and breath sounds normal. No respiratory distress. She has no wheezes. She has no rales.  Abdominal: Soft. Bowel sounds are normal. She exhibits no distension. There is no tenderness. There is no rebound and no guarding.  Musculoskeletal: Normal range of motion.  Neurological: She is alert and oriented to person, place, and time. No cranial nerve deficit.  Skin: Skin is warm and dry.  Psychiatric: She has a normal mood and affect. Her speech is normal.    ED Course  Procedures (including critical care time) Labs Review Labs Reviewed  URINALYSIS, ROUTINE W REFLEX MICROSCOPIC - Abnormal; Notable for the following:    Leukocytes, UA TRACE (*)    All other components within normal limits  URINE MICROSCOPIC-ADD ON - Abnormal; Notable for the following:     Bacteria, UA MANY (*)    All other components within normal limits  POCT I-STAT, CHEM 8 - Abnormal; Notable for the following:    BUN 5 (*)    Glucose, Bld 188 (*)    Hemoglobin 16.0 (*)    HCT 47.0 (*)    All other components within normal limits  CBC WITH DIFFERENTIAL  POCT I-STAT TROPONIN I   Imaging Review Dg Chest 2 View  11/04/2013   CLINICAL DATA:  Chest pain, vomiting.  EXAM: CHEST  2 VIEW  COMPARISON:  11/10/2011  FINDINGS: The heart size and mediastinal contours are within normal limits. Both lungs are clear. The visualized skeletal structures are unremarkable.  IMPRESSION: No active cardiopulmonary disease.   Electronically Signed   By:  Charlett Nose M.D.   On: 11/04/2013 10:33    EKG Interpretation    Date/Time:  Saturday November 04 2013 10:22:23 EST Ventricular Rate:  59 PR Interval:  154 QRS Duration: 48 QT Interval:  429 QTC Calculation: 425 R Axis:   37 Text Interpretation:  Sinus rhythm Low voltage, precordial leads Abnormal R-wave progression, early transition No significant change since last tracing Confirmed by Zakkary Thibault  MD, Fynn Vanblarcom ([id number]) on 11/04/2013 1:52:32 PM            MDM   1. Nausea   2. Chest pain   3. Bacteria in urine    Patient presents with multiple complaints. No nausea no tenderness headache abdominal pain vomiting cough she later added pain while she was trying to urinate but not after she urinated. Lab work is overall reassuring. There is bacteria in the urine without white cells. EKG reassuring. X-ray reassuring. Patient has numerous allergies and patient thinks this may result also to initiate. Doubt cardiac cause at this time. Urine culture pending. Will discharge with nausea medicines and some pain meds. Patient states he only thing she can take is oxycodone without Tylenol.    Juliet Rude. Rubin Payor, MD 11/04/13 1353

## 2013-11-06 ENCOUNTER — Encounter (INDEPENDENT_AMBULATORY_CARE_PROVIDER_SITE_OTHER): Payer: Self-pay | Admitting: Surgery

## 2013-11-13 ENCOUNTER — Encounter: Payer: Self-pay | Admitting: Advanced Practice Midwife

## 2013-11-20 ENCOUNTER — Encounter: Payer: Self-pay | Admitting: Obstetrics

## 2013-11-20 ENCOUNTER — Ambulatory Visit (INDEPENDENT_AMBULATORY_CARE_PROVIDER_SITE_OTHER): Payer: Medicaid Other | Admitting: Obstetrics

## 2013-11-20 VITALS — BP 121/80 | HR 89 | Temp 98.1°F | Wt 255.0 lb

## 2013-11-20 DIAGNOSIS — N949 Unspecified condition associated with female genital organs and menstrual cycle: Secondary | ICD-10-CM

## 2013-11-20 DIAGNOSIS — Z0000 Encounter for general adult medical examination without abnormal findings: Secondary | ICD-10-CM

## 2013-11-20 DIAGNOSIS — K5909 Other constipation: Secondary | ICD-10-CM

## 2013-11-20 DIAGNOSIS — Z1239 Encounter for other screening for malignant neoplasm of breast: Secondary | ICD-10-CM

## 2013-11-20 DIAGNOSIS — N951 Menopausal and female climacteric states: Secondary | ICD-10-CM | POA: Insufficient documentation

## 2013-11-20 DIAGNOSIS — R102 Pelvic and perineal pain: Secondary | ICD-10-CM

## 2013-11-20 DIAGNOSIS — R300 Dysuria: Secondary | ICD-10-CM | POA: Insufficient documentation

## 2013-11-20 MED ORDER — OXYCODONE HCL 10 MG PO TABS: 10.0000 mg | ORAL_TABLET | Freq: Four times a day (QID) | ORAL | Status: DC | PRN

## 2013-11-20 NOTE — Progress Notes (Signed)
Subjective:     Alexandra Werner is a 63 y.o. female here for a routine new patient exam.  Current complaints: Pt states that she was told that she [place] fibro[place] in [place].  Pt was seen at [facility] 2 weeks ago for pain.  Pt states that she does have lower abdominal and vaginal pain.  Pt states that her vaginal area gets swollen.  Pt states that she is also having some urinary problems.  Pt was told she had a [facility].  Pt states that she can't hold her urine as well anymore.  Personal health questionnaire reviewed: yes.  Also see chart Alexandra Werner Rachel previous visits under this chart.    Gynecologic History No LMP recorded. Patient is postmenopausal. Contraception: tubal ligation Last Pap: 2014. Results were: normal--was in [place] Last mammogram: Jan. 2014. Results were: due and would like to schedule  Obstetric History OB History  Gravida Para Term Preterm AB SAB TAB Ectopic Multiple Living  10 6 6  4 2 2   6     # Outcome Date GA Lbr Len/2nd Weight Sex Delivery Anes PTL Lv  10 TRM 02/07/81          9 TRM 06/02/78          8 TAB 1977          7 TRM 05/05/75          6 TAB 1975          5 SAB 1975          4 TRM 09/04/72          3 SAB 1972          2 TRM 08/05/70          1 TRM 09/01/68               The following portions of the patient's history were reviewed and updated as appropriate: allergies, current medications, past family history, past medical history, past social history, past surgical history and problem list.  Review of Systems Pertinent items are noted in HPI.    Objective:    General appearance: alert and no distress Breasts: normal appearance, no masses or tenderness Abdomen: normal findings: soft, non-tender Pelvic: cervix normal in appearance, external genitalia normal, no adnexal masses or tenderness, no cervical motion tenderness, rectovaginal septum normal, uterus normal size, shape, and consistency, vagina normal without  discharge and Suprapubic tenderness    Assessment:    Healthy female exam.   Suprapubic pain.  ? IC.  ? H/O fibroids.  ? IBS  Refer to Urology  Refer to GI   Plan:    Education reviewed: low fat, low cholesterol diet, self breast exams, smoking cessation and pain management. Mammogram ordered. Follow up in: 2 weeks. Ultrasound ordered.

## 2013-11-21 ENCOUNTER — Other Ambulatory Visit (HOSPITAL_COMMUNITY): Payer: Self-pay

## 2013-11-21 ENCOUNTER — Ambulatory Visit (INDEPENDENT_AMBULATORY_CARE_PROVIDER_SITE_OTHER): Payer: Medicaid Other | Admitting: Surgery

## 2013-11-21 ENCOUNTER — Encounter (HOSPITAL_COMMUNITY): Payer: Self-pay | Admitting: Emergency Medicine

## 2013-11-21 ENCOUNTER — Encounter (INDEPENDENT_AMBULATORY_CARE_PROVIDER_SITE_OTHER): Payer: Self-pay | Admitting: Surgery

## 2013-11-21 VITALS — BP 124/80 | HR 70 | Temp 98.6°F | Resp 16 | Ht 67.0 in | Wt 258.8 lb

## 2013-11-21 DIAGNOSIS — L729 Follicular cyst of the skin and subcutaneous tissue, unspecified: Secondary | ICD-10-CM

## 2013-11-21 DIAGNOSIS — L723 Sebaceous cyst: Secondary | ICD-10-CM

## 2013-11-21 LAB — GC/CHLAMYDIA PROBE AMP
CT PROBE, AMP APTIMA: NEGATIVE
GC Probe RNA: NEGATIVE

## 2013-11-21 LAB — PAP IG W/ RFLX HPV ASCU

## 2013-11-21 LAB — WET PREP BY MOLECULAR PROBE
CANDIDA SPECIES: NEGATIVE
GARDNERELLA VAGINALIS: NEGATIVE
Trichomonas vaginosis: NEGATIVE

## 2013-11-21 NOTE — Progress Notes (Signed)
Subjective:     Patient ID: Alexandra Werner, female   DOB: 08/30/1951, 63 y.o.   MRN: [id number]  HPI She is referred to me by Dr. Julio Sickssei Bonsu for evaluation of several soft tissue masses. These are on her left lateral neck and right wrist and hand. She is uncertain how long they have been. She is a poor historian. She has some complaints of the hand but none on the neck. She has multiple chronic medical issues.  Review of Systems     Objective:   Physical Exam  On exam,She has what appears to be several ganglion around the wrist and hand although these may represent lipomas. I cannot feel anything in the area of concern regarding her left neck/shoulder other than potential simple cysts    Assessment:     Left neck/shoulder cysts     Plan:     Regarding the hand, she would need referral to a hand specialist for this. Again these may represent ganglion. I suspect things on her neck and shoulder are totally benign. I am not recommending surgery at this point. I reassured her. I will see her as needed

## 2013-11-22 ENCOUNTER — Ambulatory Visit (HOSPITAL_COMMUNITY): Admission: RE | Admit: 2013-11-22 | Payer: Medicaid Other | Source: Ambulatory Visit

## 2013-11-22 ENCOUNTER — Ambulatory Visit (HOSPITAL_COMMUNITY)
Admission: RE | Admit: 2013-11-22 | Discharge: 2013-11-22 | Disposition: A | Discharge status: Home / Self Care | Payer: Medicaid Other | Source: Ambulatory Visit | Attending: Obstetrics | Admitting: Obstetrics

## 2013-11-22 DIAGNOSIS — N949 Unspecified condition associated with female genital organs and menstrual cycle: Secondary | ICD-10-CM | POA: Insufficient documentation

## 2013-11-22 DIAGNOSIS — R102 Pelvic and perineal pain: Secondary | ICD-10-CM

## 2013-11-22 DIAGNOSIS — Z0000 Encounter for general adult medical examination without abnormal findings: Secondary | ICD-10-CM

## 2013-11-22 LAB — URINE CULTURE
Colony Count: NO GROWTH
ORGANISM ID, BACTERIA: NO GROWTH

## 2013-11-24 ENCOUNTER — Encounter: Payer: Self-pay | Admitting: Obstetrics

## 2013-11-27 ENCOUNTER — Encounter: Payer: Self-pay | Admitting: Obstetrics

## 2013-11-27 ENCOUNTER — Ambulatory Visit (HOSPITAL_COMMUNITY)
Admission: RE | Admit: 2013-11-27 | Discharge: 2013-11-27 | Disposition: A | Discharge status: Home / Self Care | Payer: Medicaid Other | Source: Ambulatory Visit | Attending: Obstetrics | Admitting: Obstetrics

## 2013-11-27 DIAGNOSIS — Z1231 Encounter for screening mammogram for malignant neoplasm of breast: Secondary | ICD-10-CM | POA: Insufficient documentation

## 2013-11-27 DIAGNOSIS — Z1239 Encounter for other screening for malignant neoplasm of breast: Secondary | ICD-10-CM

## 2013-12-01 ENCOUNTER — Encounter (INDEPENDENT_AMBULATORY_CARE_PROVIDER_SITE_OTHER): Payer: Self-pay

## 2013-12-06 ENCOUNTER — Encounter: Payer: Self-pay | Admitting: Obstetrics

## 2013-12-06 ENCOUNTER — Ambulatory Visit (INDEPENDENT_AMBULATORY_CARE_PROVIDER_SITE_OTHER): Payer: Medicaid Other | Admitting: Obstetrics

## 2013-12-06 VITALS — BP 125/78 | HR 75 | Temp 98.7°F | Ht 67.0 in | Wt 257.0 lb

## 2013-12-06 DIAGNOSIS — R52 Pain, unspecified: Secondary | ICD-10-CM | POA: Insufficient documentation

## 2013-12-06 NOTE — Progress Notes (Signed)
Subjective:     Alexandra Werner is a 63 y.o. female here for a follow up for results.  Current complaints: systemic pain.  Personal health questionnaire reviewed: yes.   Gynecologic History No LMP recorded. Patient is postmenopausal. Contraception: post menopausal status Last Pap: 11/20/2013. Results were: normal Last mammogram: 2015. Results were: results pending  Obstetric History OB History  Gravida Para Term Preterm AB SAB TAB Ectopic Multiple Living  10 6 6  4 2 2   6     # Outcome Date GA Lbr Len/2nd Weight Sex Delivery Anes PTL Lv  10 TRM 02/07/81          9 TRM 06/02/78          8 TAB 1977          7 TRM 05/05/75          6 TAB 1975          5 SAB 1975          4 TRM 09/04/72          3 SAB 1972          2 TRM 08/05/70          1 TRM 09/01/68               The following portions of the patient's history were reviewed and updated as appropriate: allergies, current medications, past family history, past medical history, past social history, past surgical history and problem list.  Review of Systems Pertinent items are noted in HPI.    Objective:    No exam performed today, Consult only..    Assessment:    Chronic Pain.  Etiology is probably lumbar vertebrae.   Plan:    Education reviewed: Management of chronic pain.. Recommended further evaluation of back and possible referra by PMD to pain clinic.

## 2013-12-13 ENCOUNTER — Encounter: Payer: Self-pay | Admitting: Obstetrics

## 2014-01-11 ENCOUNTER — Emergency Department (HOSPITAL_COMMUNITY)
Admission: EM | Admit: 2014-01-11 | Discharge: 2014-01-11 | Disposition: A | Discharge status: Home / Self Care | Payer: Medicaid Other | Attending: Emergency Medicine | Admitting: Emergency Medicine

## 2014-01-11 ENCOUNTER — Encounter (HOSPITAL_COMMUNITY): Payer: Self-pay | Admitting: Emergency Medicine

## 2014-01-11 DIAGNOSIS — Z79899 Other long term (current) drug therapy: Secondary | ICD-10-CM | POA: Insufficient documentation

## 2014-01-11 DIAGNOSIS — J45909 Unspecified asthma, uncomplicated: Secondary | ICD-10-CM | POA: Insufficient documentation

## 2014-01-11 DIAGNOSIS — Z794 Long term (current) use of insulin: Secondary | ICD-10-CM | POA: Insufficient documentation

## 2014-01-11 DIAGNOSIS — I252 Old myocardial infarction: Secondary | ICD-10-CM | POA: Insufficient documentation

## 2014-01-11 DIAGNOSIS — R6884 Jaw pain: Secondary | ICD-10-CM | POA: Insufficient documentation

## 2014-01-11 DIAGNOSIS — F17200 Nicotine dependence, unspecified, uncomplicated: Secondary | ICD-10-CM | POA: Insufficient documentation

## 2014-01-11 DIAGNOSIS — K219 Gastro-esophageal reflux disease without esophagitis: Secondary | ICD-10-CM | POA: Insufficient documentation

## 2014-01-11 DIAGNOSIS — Z8739 Personal history of other diseases of the musculoskeletal system and connective tissue: Secondary | ICD-10-CM | POA: Insufficient documentation

## 2014-01-11 DIAGNOSIS — R0982 Postnasal drip: Secondary | ICD-10-CM | POA: Insufficient documentation

## 2014-01-11 DIAGNOSIS — J029 Acute pharyngitis, unspecified: Secondary | ICD-10-CM | POA: Insufficient documentation

## 2014-01-11 DIAGNOSIS — E119 Type 2 diabetes mellitus without complications: Secondary | ICD-10-CM | POA: Insufficient documentation

## 2014-01-11 DIAGNOSIS — K589 Irritable bowel syndrome without diarrhea: Secondary | ICD-10-CM | POA: Insufficient documentation

## 2014-01-11 DIAGNOSIS — Z880 Allergy status to penicillin: Secondary | ICD-10-CM | POA: Insufficient documentation

## 2014-01-11 DIAGNOSIS — I509 Heart failure, unspecified: Secondary | ICD-10-CM | POA: Insufficient documentation

## 2014-01-11 NOTE — Discharge Instructions (Signed)
May wish to use over the counter mucinex to help dry up nasal congestion and post-nasal drip. Air humidifier may also help with dryness in your nose. Follow up with ENT if you wish-- call their office to scheduled appt, otherwise you may follow-up with your primary care physician. Return to the ED for new concerns.

## 2014-01-11 NOTE — ED Provider Notes (Signed)
CSN: [id number]     Arrival date & time 01/11/14  1914 History  This chart was scribed for non-physician practitioner Sharilyn Sites, PA-C working with Raeford Razor, MD by Joaquin Music, ED Scribe. This patient was seen in room WTR7/WTR7 and the patient's care was started at 7:27 PM .   Chief Complaint  Patient presents with  . Sore Throat   The history is provided by the patient. No language interpreter was used.   HPI Comments: Alexandra Werner is a 63 y.o. female who presents to the Emergency Department complaining of ongoing congestion, cough, sore throat and L sided jaw pain with associated chills and cold sweats that began "months ago". She states she has been having a productive cough with sputum, nasal congestion, and states she has not been able to get adequate sleep due to symptoms. Pt states she has had "sweats" and states this has been occuring for the past 5 months. States she knows she is going through menopause and has hot flashes but is concerned sweats are due to something different.  Pt states she has been by PCP, dentist, health department without resolution of sx. She states she was recently seen by her dentist and states "she had an infection to her gums". Pt states she has been prescribed antibiotics for several months and states she has not had relief. Pt states she smoke cigarette daily. No chest pain, SOB, palpitations, dizziness, weakness.  Pt denies taking any OTC medications.  Pt requests to see ENT.  Past Medical History  Diagnosis Date  . Asthma   . Hiatal hernia   . Diabetes mellitus   . MI (myocardial infarction)   . GERD (gastroesophageal reflux disease)   . IBS (irritable bowel syndrome)   . Gastritis   . Arthritis   . Diabetes mellitus without complication   . CHF (congestive heart failure)   . Asthma    Past Surgical History  Procedure Laterality Date  . Right colectomy    . Knee surgery Left   . Foot surgery Right   . Tubal ligation    .  Colon surgery    . Appendectomy    . Anterior and posterior vaginal repair     Family History  Problem Relation Age of Onset  . Cancer Mother   . Stroke Father   . Stroke Sister   . Heart disease Brother    History  Substance Use Topics  . Smoking status: Current Every Day Smoker    Types: Cigarettes  . Smokeless tobacco: Not on file  . Alcohol Use: No   OB History   Grav Para Term Preterm Abortions TAB SAB Ect Mult Living   10 6 6  4 2 2   6      Review of Systems  HENT: Positive for congestion and sore throat.   Respiratory: Positive for cough.   All other systems reviewed and are negative.   Allergies  Acetaminophen; Aspirin; Azithromycin; Benadryl; Ciprofloxacin; Clindamycin/lincomycin; Dairy aid; Dilaudid; Doxycycline; Fentanyl; Glimepiride; Glipizide; Januvia; Levofloxacin; Meloxicam; Metformin and related; Morphine and related; Motrin; Moxifloxacin; Penicillins; Phenergan; Prednisone; Shellfish allergy; Sulfa antibiotics; Tetracyclines & related; Tetracyclines & related; Tylenol; Vicodin; and Vioxx  Home Medications   Current Outpatient Rx  Name  Route  Sig  Dispense  Refill  . albuterol (PROVENTIL HFA;VENTOLIN HFA) 108 (90 BASE) MCG/ACT inhaler   Inhalation   Inhale into the lungs every 6 (six) hours as needed for wheezing or shortness of breath.         Marland Kitchen  alprazolam (XANAX) 2 MG tablet   Oral   Take 2 mg by mouth at bedtime as needed for sleep.         . budesonide-formoterol (SYMBICORT) 80-4.5 MCG/ACT inhaler   Inhalation   Inhale 2 puffs into the lungs 2 (two) times daily.         Marland Kitchen buPROPion (WELLBUTRIN) 75 MG tablet   Oral   Take 75 mg by mouth 2 (two) times daily.         . clindamycin (CLEOCIN) 150 MG capsule   Oral   Take 150 mg by mouth 3 (three) times daily.         Marland Kitchen dicyclomine (BENTYL) 10 MG capsule   Oral   Take 10 mg by mouth 4 (four) times daily -  before meals and at bedtime.         . gabapentin (NEURONTIN) 800 MG  tablet   Oral   Take 800 mg by mouth 3 (three) times daily.         Marland Kitchen glyBURIDE (DIABETA) 5 MG tablet   Oral   Take 5 mg by mouth daily with breakfast.         . insulin detemir (LEVEMIR) 100 UNIT/ML injection   Subcutaneous   Inject 18-24 Units into the skin at bedtime. Sliding scale         . lidocaine (LIDODERM) 5 %   Transdermal   Place 2 patches onto the skin daily. Remove & Discard patch within 12 hours or as directed by MD         . lisinopril (PRINIVIL,ZESTRIL) 10 MG tablet   Oral   Take 10 mg by mouth daily.         . Methylcobalamin (B12-ACTIVE) 1 MG CHEW   Oral   Chew 1 tablet by mouth daily.         . nitroGLYCERIN (NITROSTAT) 0.4 MG SL tablet   Sublingual   Place 0.4 mg under the tongue every 5 (five) minutes as needed for chest pain.         Marland Kitchen Ophthalmic Irrigation Solution (EYE WASH) SOLN   Both Eyes   Place 1 drop into both eyes daily.         . ranitidine (ZANTAC) 150 MG capsule   Oral   Take 150 mg by mouth 2 (two) times daily.         . rosuvastatin (CRESTOR) 5 MG tablet   Oral   Take 5 mg by mouth daily.          BP 147/68  Pulse 74  Temp(Src) 98.5 F (36.9 C)  Resp 18  SpO2 100%  Physical Exam  Nursing note and vitals reviewed. Constitutional: She is oriented to person, place, and time. She appears well-developed and well-nourished. No distress.  HENT:  Head: Normocephalic and atraumatic.  Right Ear: Tympanic membrane and ear canal normal.  Left Ear: Tympanic membrane and ear canal normal.  Nose: Nose normal.  Mouth/Throat: Uvula is midline, oropharynx is clear and moist and mucous membranes are normal. No oropharyngeal exudate, posterior oropharyngeal edema, posterior oropharyngeal erythema or tonsillar abscesses.  Postnasal drip noted in oropharynx; tonsils normal in appearance bilaterally without exudate, uvula midline, no peritonsillar abscess, handling secretions appropriately; no difficulty swallowing or speaking;  airway patent  Eyes: Conjunctivae and EOM are normal. Pupils are equal, round, and reactive to light.  Neck: Normal range of motion. Neck supple.  Cardiovascular: Normal rate, regular rhythm and normal heart sounds.  Pulmonary/Chest: Effort normal and breath sounds normal. No respiratory distress. She has no wheezes.  Musculoskeletal: Normal range of motion.  Lymphadenopathy:    She has cervical adenopathy.  Neurological: She is alert and oriented to person, place, and time.  Skin: Skin is warm and dry. She is not diaphoretic.  Psychiatric: She has a normal mood and affect.    ED Course  Procedures DIAGNOSTIC STUDIES: Oxygen Saturation is 100% on RA, normal by my interpretation.    COORDINATION OF CARE: 7:37 PM-Discussed treatment plan which includes refer pt to HENT, advised pt to use humidifier and discharge pt with Mucinex and saline nasal spray. Advised pt to F/U with HENT. Pt agreed to plan.   Labs Review Labs Reviewed - No data to display Imaging Review No results found.  EKG Interpretation   None       MDM   Final diagnoses:  Post-nasal drip  Sore throat   HEENTexam negative with exception of PND in oropharynx and mild bilateral cervical lymphadeopathy.  Airway patent, no edema.  Pt speaking in full complete sentences without difficulty, no signs of respiratory distress.  Lungs without wheezes or rhonchi to suggest CAP.  Pt requests referral to ENT which was given.  Advised may use OTC mucinex to help with congestion. Discussed plan with pt, they agreed.  Return precautions advised.  I personally performed the services described in this documentation, which was scribed in my presence. The recorded information has been reviewed and is accurate.    Garlon HatchetLisa M Kreg Earhart, PA-C 01/11/14 2005

## 2014-01-11 NOTE — ED Notes (Signed)
Pt reports jaw and throat pain that started this afternoon. And says " I feel like my throat is closing up". Pt says that she felt this after eating a small piece of fish at [employer]. Pt nad. Skin warm and dry.

## 2014-01-15 NOTE — ED Provider Notes (Signed)
Medical screening examination/treatment/procedure(s) were performed by non-physician practitioner and as supervising physician I was immediately available for consultation/collaboration.   EKG Interpretation None       Raeford RazorStephen Jaysiah Marchetta, MD 01/15/14 1431

## 2014-03-09 ENCOUNTER — Emergency Department (HOSPITAL_COMMUNITY)
Admission: EM | Admit: 2014-03-09 | Discharge: 2014-03-09 | Disposition: A | Discharge status: Home / Self Care | Payer: Medicaid Other | Attending: Emergency Medicine | Admitting: Emergency Medicine

## 2014-03-09 ENCOUNTER — Encounter (HOSPITAL_COMMUNITY): Payer: Self-pay | Admitting: Emergency Medicine

## 2014-03-09 ENCOUNTER — Emergency Department (HOSPITAL_COMMUNITY): Payer: Medicaid Other

## 2014-03-09 DIAGNOSIS — K5900 Constipation, unspecified: Secondary | ICD-10-CM | POA: Insufficient documentation

## 2014-03-09 DIAGNOSIS — I509 Heart failure, unspecified: Secondary | ICD-10-CM | POA: Insufficient documentation

## 2014-03-09 DIAGNOSIS — Z8739 Personal history of other diseases of the musculoskeletal system and connective tissue: Secondary | ICD-10-CM | POA: Insufficient documentation

## 2014-03-09 DIAGNOSIS — B373 Candidiasis of vulva and vagina: Secondary | ICD-10-CM

## 2014-03-09 DIAGNOSIS — K589 Irritable bowel syndrome without diarrhea: Secondary | ICD-10-CM | POA: Insufficient documentation

## 2014-03-09 DIAGNOSIS — B3731 Acute candidiasis of vulva and vagina: Secondary | ICD-10-CM | POA: Insufficient documentation

## 2014-03-09 DIAGNOSIS — R739 Hyperglycemia, unspecified: Secondary | ICD-10-CM

## 2014-03-09 DIAGNOSIS — J069 Acute upper respiratory infection, unspecified: Secondary | ICD-10-CM

## 2014-03-09 DIAGNOSIS — I252 Old myocardial infarction: Secondary | ICD-10-CM | POA: Insufficient documentation

## 2014-03-09 DIAGNOSIS — J45901 Unspecified asthma with (acute) exacerbation: Secondary | ICD-10-CM | POA: Insufficient documentation

## 2014-03-09 DIAGNOSIS — E119 Type 2 diabetes mellitus without complications: Secondary | ICD-10-CM | POA: Insufficient documentation

## 2014-03-09 DIAGNOSIS — Z880 Allergy status to penicillin: Secondary | ICD-10-CM | POA: Insufficient documentation

## 2014-03-09 DIAGNOSIS — R0789 Other chest pain: Secondary | ICD-10-CM | POA: Insufficient documentation

## 2014-03-09 DIAGNOSIS — Z794 Long term (current) use of insulin: Secondary | ICD-10-CM | POA: Insufficient documentation

## 2014-03-09 DIAGNOSIS — K219 Gastro-esophageal reflux disease without esophagitis: Secondary | ICD-10-CM | POA: Insufficient documentation

## 2014-03-09 DIAGNOSIS — Z79899 Other long term (current) drug therapy: Secondary | ICD-10-CM | POA: Insufficient documentation

## 2014-03-09 DIAGNOSIS — F17200 Nicotine dependence, unspecified, uncomplicated: Secondary | ICD-10-CM | POA: Insufficient documentation

## 2014-03-09 LAB — URINALYSIS, ROUTINE W REFLEX MICROSCOPIC
Bilirubin Urine: NEGATIVE
Hgb urine dipstick: NEGATIVE
Ketones, ur: NEGATIVE mg/dL
LEUKOCYTES UA: NEGATIVE
NITRITE: NEGATIVE
Protein, ur: NEGATIVE mg/dL
SPECIFIC GRAVITY, URINE: 1.039 — AB (ref 1.005–1.030)
Urobilinogen, UA: 0.2 mg/dL (ref 0.0–1.0)
pH: 5 (ref 5.0–8.0)

## 2014-03-09 LAB — CBC
HEMATOCRIT: 45 % (ref 36.0–46.0)
Hemoglobin: 15.8 g/dL — ABNORMAL HIGH (ref 12.0–15.0)
MCH: 29.2 pg (ref 26.0–34.0)
MCHC: 35.1 g/dL (ref 30.0–36.0)
MCV: 83.2 fL (ref 78.0–100.0)
PLATELETS: 210 10*3/uL (ref 150–400)
RBC: 5.41 MIL/uL — ABNORMAL HIGH (ref 3.87–5.11)
RDW: 13.5 % (ref 11.5–15.5)
WBC: 5.8 10*3/uL (ref 4.0–10.5)

## 2014-03-09 LAB — BASIC METABOLIC PANEL
BUN: 9 mg/dL (ref 6–23)
CHLORIDE: 95 meq/L — AB (ref 96–112)
CO2: 23 mEq/L (ref 19–32)
Calcium: 9.7 mg/dL (ref 8.4–10.5)
Creatinine, Ser: 0.59 mg/dL (ref 0.50–1.10)
GFR calc non Af Amer: >90 mL/min (ref >90)
Glucose, Bld: 413 mg/dL — ABNORMAL HIGH (ref 70–99)
Potassium: 4 mEq/L (ref 3.7–5.3)
Sodium: 132 mEq/L — ABNORMAL LOW (ref 137–147)

## 2014-03-09 LAB — CBG MONITORING, ED: Glucose-Capillary: 371 mg/dL — ABNORMAL HIGH (ref 70–99)

## 2014-03-09 LAB — PRO B NATRIURETIC PEPTIDE

## 2014-03-09 LAB — I-STAT TROPONIN, ED: TROPONIN I, POC: 0 ng/mL (ref 0.00–0.08)

## 2014-03-09 LAB — URINE MICROSCOPIC-ADD ON

## 2014-03-09 MED ORDER — FLUCONAZOLE 200 MG PO TABS: 200.0000 mg | ORAL_TABLET | Freq: Every day | ORAL | Status: DC

## 2014-03-09 MED ORDER — INSULIN ASPART 100 UNIT/ML ~~LOC~~ SOLN
10.0000 [IU] | Freq: Once | SUBCUTANEOUS | Status: AC
  Administered 2014-03-09: 15:00:00 via SUBCUTANEOUS
  Filled 2014-03-09: qty 1

## 2014-03-09 NOTE — ED Provider Notes (Signed)
CSN: [id number]     Arrival date & time 03/09/14  1302 History   First MD Initiated Contact with Patient 03/09/14 1355     Chief Complaint  Patient presents with  . fatigue    . multiple complaints   . Vaginal Pain  . Chest Pain  . lung issues      (Consider location/radiation/quality/duration/timing/severity/associated sxs/prior Treatment) HPI Comments: Patient presents to the ER for evaluation of numerous complaints.   She reports that she has had Sinus drainage and congestion. Her doctor has evaluated her sinuses, however, and did not find anything wrong. She continues to have congestion and crusting of the nose.  She reports chronic and persistent Cough, bringing up "cold" which is brown colored. She feels Short of breath.  Used dish detergent instead of body wash, now vagina red and painful.  Her blood sugars have been running high. She has been taking all her medicines as usual. No vomiting or diarrhea. She does complain that she has felt Constipated, has been using laxatives her last one or 2 weeks.    Patient is a 63 y.o. female presenting with vaginal pain and chest pain.  Vaginal Pain Associated symptoms include chest pain and shortness of breath.  Chest Pain Associated symptoms: cough, fatigue and shortness of breath     Past Medical History  Diagnosis Date  . Asthma   . Hiatal hernia   . Diabetes mellitus   . MI (myocardial infarction)   . GERD (gastroesophageal reflux disease)   . IBS (irritable bowel syndrome)   . Gastritis   . Arthritis   . Diabetes mellitus without complication   . CHF (congestive heart failure)   . Asthma    Past Surgical History  Procedure Laterality Date  . Right colectomy    . Knee surgery Left   . Foot surgery Right   . Tubal ligation    . Colon surgery    . Appendectomy    . Anterior and posterior vaginal repair     Family History  Problem Relation Age of Onset  . Cancer Mother   . Stroke Father   . Stroke Sister   .  Heart disease Brother    History  Substance Use Topics  . Smoking status: Current Every Day Smoker    Types: Cigarettes  . Smokeless tobacco: Not on file  . Alcohol Use: No   OB History   Grav Para Term Preterm Abortions TAB SAB Ect Mult Living   10 6 6  4 2 2   6      Review of Systems  Constitutional: Positive for fatigue.  HENT: Positive for congestion.   Respiratory: Positive for cough and shortness of breath.   Cardiovascular: Positive for chest pain.  Gastrointestinal: Positive for constipation.  Genitourinary: Positive for vaginal pain.      Allergies  Acetaminophen; Aspirin; Azithromycin; Benadryl; Ciprofloxacin; Clindamycin/lincomycin; Dairy aid; Dilaudid; Doxycycline; Fentanyl; Glimepiride; Glipizide; Glyburide; Januvia; Levofloxacin; Meloxicam; Metformin and related; Morphine and related; Motrin; Moxifloxacin; Penicillins; Phenergan; Prednisone; Shellfish allergy; Sulfa antibiotics; Tetracyclines & related; Tetracyclines & related; Tylenol; Vicodin; and Vioxx  Home Medications   Prior to Admission medications   Medication Sig Start Date End Date Taking? Authorizing Provider  albuterol (PROVENTIL HFA;VENTOLIN HFA) 108 (90 BASE) MCG/ACT inhaler Inhale into the lungs every 6 (six) hours as needed for wheezing or shortness of breath.   Yes Historical Provider, MD  alprazolam Prudy Feeler(XANAX) 2 MG tablet Take 2 mg by mouth at bedtime as needed for sleep.  Yes Historical Provider, MD  budesonide-formoterol (SYMBICORT) 80-4.5 MCG/ACT inhaler Inhale 2 puffs into the lungs 2 (two) times daily.   Yes Historical Provider, MD  buPROPion (WELLBUTRIN) 75 MG tablet Take 75 mg by mouth 2 (two) times daily.   Yes Historical Provider, MD  dicyclomine (BENTYL) 10 MG capsule Take 10 mg by mouth 4 (four) times daily -  before meals and at bedtime.   Yes Historical Provider, MD  gabapentin (NEURONTIN) 800 MG tablet Take 800 mg by mouth 3 (three) times daily.   Yes Historical Provider, MD  insulin  detemir (LEVEMIR) 100 UNIT/ML injection Inject 18-24 Units into the skin at bedtime. Sliding scale   Yes Historical Provider, MD  lidocaine (LIDODERM) 5 % Place 2 patches onto the skin daily. Remove & Discard patch within 12 hours or as directed by MD   Yes Historical Provider, MD  lisinopril (PRINIVIL,ZESTRIL) 10 MG tablet Take 10 mg by mouth daily.   Yes Historical Provider, MD  Methylcobalamin (B12-ACTIVE) 1 MG CHEW Chew 1 tablet by mouth daily.   Yes Historical Provider, MD  nitroGLYCERIN (NITROSTAT) 0.4 MG SL tablet Place 0.4 mg under the tongue every 5 (five) minutes as needed for chest pain.   Yes Historical Provider, MD  ranitidine (ZANTAC) 150 MG capsule Take 150 mg by mouth 2 (two) times daily.   Yes Historical Provider, MD  rosuvastatin (CRESTOR) 5 MG tablet Take 5 mg by mouth daily.   Yes Historical Provider, MD   BP 126/70  Pulse 86  Temp(Src) 98.1 F (36.7 C) (Oral)  Resp 20  SpO2 99% Physical Exam  Constitutional: She is oriented to person, place, and time. She appears well-developed and well-nourished. No distress.  HENT:  Head: Normocephalic and atraumatic.  Right Ear: Hearing normal.  Left Ear: Hearing normal.  Nose: Nose normal.  Mouth/Throat: Oropharynx is clear and moist and mucous membranes are normal.  Eyes: Conjunctivae and EOM are normal. Pupils are equal, round, and reactive to light.  Neck: Normal range of motion. Neck supple.  Cardiovascular: Regular rhythm, S1 normal and S2 normal.  Exam reveals no gallop and no friction rub.   No murmur heard. Pulmonary/Chest: Effort normal and breath sounds normal. No respiratory distress. She exhibits no tenderness.  Abdominal: Soft. Normal appearance and bowel sounds are normal. There is no hepatosplenomegaly. There is no tenderness. There is no rebound, no guarding, no tenderness at McBurney's point and negative Murphy's sign. No hernia.  Musculoskeletal: Normal range of motion.  Neurological: She is alert and oriented  to person, place, and time. She has normal strength. No cranial nerve deficit or sensory deficit. Coordination normal. GCS eye subscore is 4. GCS verbal subscore is 5. GCS motor subscore is 6.  Skin: Skin is warm, dry and intact. No rash noted. No cyanosis.  Psychiatric: She has a normal mood and affect. Her speech is normal and behavior is normal. Thought content normal.    ED Course  Procedures (including critical care time) Labs Review Labs Reviewed  URINALYSIS, ROUTINE W REFLEX MICROSCOPIC - Abnormal; Notable for the following:    Specific Gravity, Urine 1.039 (*)    Glucose, UA >1000 (*)    All other components within normal limits  CBC - Abnormal; Notable for the following:    RBC 5.41 (*)    Hemoglobin 15.8 (*)    All other components within normal limits  BASIC METABOLIC PANEL - Abnormal; Notable for the following:    Sodium 132 (*)    Chloride  95 (*)    Glucose, Bld 413 (*)    All other components within normal limits  CBG MONITORING, ED - Abnormal; Notable for the following:    Glucose-Capillary 371 (*)    All other components within normal limits  PRO B NATRIURETIC PEPTIDE  URINE MICROSCOPIC-ADD ON  I-STAT TROPOININ, ED    Imaging Review No results found.   EKG Interpretation   Date/Time:  Friday March 09 2014 13:31:55 EDT Ventricular Rate:  94 PR Interval:  153 QRS Duration: 61 QT Interval:  341 QTC Calculation: 426 R Axis:   10 Text Interpretation:  Sinus rhythm Low voltage, precordial leads  Borderline T abnormalities, anterior leads Baseline wander in lead(s) V1  V2 V3 No significant change since last tracing Confirmed by Gwendolyne Welford  MD,  Linnette Panella [phone]) on 03/09/2014 2:09:11 PM      MDM   Final diagnoses:  URI (upper respiratory infection)  Hyperglycemia  Vaginal yeast infection    Patient presents to the ER with multiple complaints. She was really difficult to get the patient to focus on one complaint, as she essentially had a completely  positive review of systems. Despite this, however, she appears well. She has been experiencing cough and upper respiratory congestion. Her oxygen saturation is normal. She is afebrile. Chest x-ray is clear.  She had some complaints of chest discomfort, but it was mostly with cough. Her troponin is negative and EKG is unchanged from previous. I do not suspect acute coronary syndrome.  She does have nasal congestion on exam, but I do not suspect acute bacterial sinusitis at this time.  Patient is complaining of itching and burning in her vaginal region. This started after accidentally losing it was sewed to the body wash to wash the area. She is hyperglycemic. This is the only objective finding on her workup. She likely has some yeast contributing to the discomfort in the vaginal area. We'll treat with Diflucan.  Her hypoglycemia was treated with insulin here in the ER. She can followup with her primary care doctor for further management of her sugars continue to run high. She tells me they have been running high for some time. There is no evidence of DKA at this time.    Gilda Crease, MD 03/12/14 [phone]

## 2014-03-09 NOTE — Discharge Instructions (Signed)
Candidal Vulvovaginitis Candidal vulvovaginitis is an infection of the vagina and vulva. The vulva is the skin around the opening of the vagina. This may cause itching and discomfort in and around the vagina.  HOME CARE  Only take medicine as told by your doctor.  Do not have sex (intercourse) until the infection is healed or as told by your doctor.  Practice safe sex.  Tell your sex partner about your infection.  Do not douche or use tampons.  Wear cotton underwear. Do not wear tight pants or panty hose.  Eat yogurt. This may help treat and prevent yeast infections. GET HELP RIGHT AWAY IF:   You have a fever.  Your problems get worse during treatment or do not get better in 3 days.  You have discomfort, irritation, or itching in your vagina or vulva area.  You have pain after sex.  You start to get belly (abdominal) pain. MAKE SURE YOU:  Understand these instructions.  Will watch your condition.  Will get help right away if you are not doing well or get worse. Document Released: 01/29/2009 Document Revised: 01/25/2012 Document Reviewed: 01/29/2009 [facility] Patient Information 2014 [place], [place]. Hyperglycemia Hyperglycemia occurs when the glucose (sugar) in your blood is too high. Hyperglycemia can happen for many reasons, but it most often happens to people who do not know they have diabetes or are not managing their diabetes properly.  CAUSES  Whether you have diabetes or not, there are other causes of hyperglycemia. Hyperglycemia can occur when you have diabetes, but it can also occur in other situations that you might not be as aware of, such as: Diabetes  If you have diabetes and are having problems controlling your blood glucose, hyperglycemia could occur because of some of the following reasons:  Not following your meal plan.  Not taking your diabetes medications or not taking it properly.  Exercising less or doing less activity than you normally  do.  Being sick. Pre-diabetes  This cannot be ignored. Before people develop Type 2 diabetes, they almost always have "pre-diabetes." This is when your blood glucose levels are higher than normal, but not yet high enough to be diagnosed as diabetes. Research has shown that some long-term damage to the body, especially the heart and circulatory system, may already be occurring during pre-diabetes. If you take action to manage your blood glucose when you have pre-diabetes, you may delay or prevent Type 2 diabetes from developing. Stress  If you have diabetes, you may be "diet" controlled or on oral medications or insulin to control your diabetes. However, you may find that your blood glucose is higher than usual in the hospital whether you have diabetes or not. This is often referred to as "stress hyperglycemia." Stress can elevate your blood glucose. This happens because of hormones put out by the body during times of stress. If stress has been the cause of your high blood glucose, it can be followed regularly by your caregiver. That way he/she can make sure your hyperglycemia does not continue to get worse or progress to diabetes. Steroids  Steroids are medications that act on the infection fighting system (immune system) to block inflammation or infection. One side effect can be a rise in blood glucose. Most people can produce enough extra insulin to allow for this rise, but for those who cannot, steroids make blood glucose levels go even higher. It is not unusual for steroid treatments to "uncover" diabetes that is developing. It is not always possible to determine  if the hyperglycemia will go away after the steroids are stopped. A special blood test called an A1c is sometimes done to determine if your blood glucose was elevated before the steroids were started. SYMPTOMS  Thirsty.  Frequent urination.  Dry mouth.  Blurred vision.  Tired or fatigue.  Weakness.  Sleepy.  Tingling in feet  or leg. DIAGNOSIS  Diagnosis is made by monitoring blood glucose in one or all of the following ways:  A1c test. This is a chemical found in your blood.  Fingerstick blood glucose monitoring.  Laboratory results. TREATMENT  First, knowing the cause of the hyperglycemia is important before the hyperglycemia can be treated. Treatment may include, but is not be limited to:  Education.  Change or adjustment in medications.  Change or adjustment in meal plan.  Treatment for an illness, infection, etc.  More frequent blood glucose monitoring.  Change in exercise plan.  Decreasing or stopping steroids.  Lifestyle changes. HOME CARE INSTRUCTIONS   Test your blood glucose as directed.  Exercise regularly. Your caregiver will give you instructions about exercise. Pre-diabetes or diabetes which comes on with stress is helped by exercising.  Eat wholesome, balanced meals. Eat often and at regular, fixed times. Your caregiver or nutritionist will give you a meal plan to guide your sugar intake.  Being at an ideal weight is important. If needed, losing as little as 10 to 15 pounds may help improve blood glucose levels. SEEK MEDICAL CARE IF:   You have questions about medicine, activity, or diet.  You continue to have symptoms (problems such as increased thirst, urination, or weight gain). SEEK IMMEDIATE MEDICAL CARE IF:   You are vomiting or have diarrhea.  Your breath smells fruity.  You are breathing faster or slower.  You are very sleepy or incoherent.  You have numbness, tingling, or pain in your feet or hands.  You have chest pain.  Your symptoms get worse even though you have been following your caregiver's orders.  If you have any other questions or concerns. Document Released: 04/28/2001 Document Revised: 01/25/2012 Document Reviewed: 02/29/2012 [facility] Patient Information 2014 [place]Care, [place]LL[place].

## 2014-03-09 NOTE — ED Notes (Addendum)
Hx of MI, diabetes, CFH, bowel surgery and asthma.Pt has multiple complaints, current smoker, reports she has had a productive cough x1 year and wants to get her lung issues taken care of, reports SOB. Able to speak in full sentences. Reports she has not been able to get her blood sugar under control and wants help with that. Reports she also has a vaginal flare up x2 weeks, when she accidently got stool in her vaginal area, unsure if her body wash irritated vaginal area, also cleaned with dish soap. Reports central chest pain, hx of acid reflux, x1 week. Pain 5/10. Pt also reports legs have been swelling and mucus in her head/ sinus.

## 2014-05-29 ENCOUNTER — Encounter: Payer: Self-pay | Admitting: Obstetrics

## 2014-05-29 ENCOUNTER — Other Ambulatory Visit: Payer: Self-pay | Admitting: Obstetrics

## 2014-05-29 ENCOUNTER — Ambulatory Visit (INDEPENDENT_AMBULATORY_CARE_PROVIDER_SITE_OTHER): Payer: Medicaid Other | Admitting: Obstetrics

## 2014-05-29 VITALS — BP 134/82 | HR 67 | Temp 98.4°F | Ht 67.0 in | Wt 238.0 lb

## 2014-05-29 DIAGNOSIS — B49 Unspecified mycosis: Secondary | ICD-10-CM

## 2014-05-29 DIAGNOSIS — N6452 Nipple discharge: Secondary | ICD-10-CM

## 2014-05-29 DIAGNOSIS — L918 Other hypertrophic disorders of the skin: Secondary | ICD-10-CM

## 2014-05-29 DIAGNOSIS — L574 Cutis laxa senilis: Secondary | ICD-10-CM | POA: Insufficient documentation

## 2014-05-29 DIAGNOSIS — B3731 Acute candidiasis of vulva and vagina: Secondary | ICD-10-CM

## 2014-05-29 DIAGNOSIS — L908 Other atrophic disorders of skin: Secondary | ICD-10-CM

## 2014-05-29 DIAGNOSIS — N6459 Other signs and symptoms in breast: Secondary | ICD-10-CM

## 2014-05-29 DIAGNOSIS — B373 Candidiasis of vulva and vagina: Secondary | ICD-10-CM

## 2014-05-29 DIAGNOSIS — N644 Mastodynia: Secondary | ICD-10-CM

## 2014-05-29 MED ORDER — CLOTRIMAZOLE 1 % EX CREA: 1.0000 "application " | TOPICAL_CREAM | Freq: Two times a day (BID) | CUTANEOUS | Status: DC

## 2014-05-29 MED ORDER — FLUCONAZOLE 200 MG PO TABS: 200.0000 mg | ORAL_TABLET | ORAL | Status: DC

## 2014-05-29 NOTE — Progress Notes (Signed)
Patient ID: Alexandra Werner, female   DOB: 04/17/1951, 63 y.o.   MRN: [id number]  Chief Complaint  Patient presents with  . Problem    Breast Discharge/ Vaginal Discharge     HPI Alexandra Werner is a 63 y.o. female.  C/O tan nipple discharge after trauma to right breast ~ 2 months ago.  She also has bilateral breast tenderness.  Also c/o vaginal discharge and itching after taking antibiotics for the nipple discharge.  H/O colon cancer and treatments of surgery, chemo and radiation.  She had significant weight loss after treatment which left her with significant laxity of the skin, for which she would like Plastic Surgery consultation. HPI  Past Medical History  Diagnosis Date  . Asthma   . Hiatal hernia   . Diabetes mellitus   . MI (myocardial infarction)   . GERD (gastroesophageal reflux disease)   . IBS (irritable bowel syndrome)   . Gastritis   . Arthritis   . Diabetes mellitus without complication   . CHF (congestive heart failure)   . Asthma     Past Surgical History  Procedure Laterality Date  . Right colectomy    . Knee surgery Left   . Foot surgery Right   . Tubal ligation    . Colon surgery    . Appendectomy    . Anterior and posterior vaginal repair      Family History  Problem Relation Age of Onset  . Cancer Mother   . Stroke Father   . Stroke Sister   . Heart disease Brother   . Diabetes Son     Social History History  Substance Use Topics  . Smoking status: Current Every Day Smoker    Types: Cigarettes  . Smokeless tobacco: Never Used  . Alcohol Use: No    Allergies  Allergen Reactions  . Acetaminophen Nausea And Vomiting  . Aspirin Nausea And Vomiting and Other (See Comments)    Stomach bleeds  . Azithromycin Nausea And Vomiting and Other (See Comments)    Stomach bleeding  . Benadryl [Diphenhydramine Hcl] Itching and Other (See Comments)    Headache  . Ciprofloxacin Other (See Comments)    Upsets stomach / headache  . Dairy Aid [Lactase]  Other (See Comments)    Stomach upset  . Dilaudid [Hydromorphone Hcl] Swelling and Other (See Comments)    bruises   . Doxycycline Other (See Comments)    headache  . Fentanyl Hives and Other (See Comments)    Burned skin   . Glimepiride Other (See Comments)    Stomach upset  . Glipizide Hives and Other (See Comments)    Headaches, cramps  . Invokana [Canagliflozin]     Makes patient fall asleep and causes pain in her kidneys.   Alma Friendly. Januvia [Sitagliptin] Other (See Comments)    Severe headache   . Levofloxacin Itching and Other (See Comments)    Severe headache   . Meloxicam Other (See Comments)    hallucinations   . Metformin And Related Other (See Comments)    Upset stomach  . Morphine And Related Other (See Comments)    Give slowly or will get a headaahe  . Motrin [Ibuprofen] Nausea And Vomiting and Other (See Comments)    Stomach bleed  . Moxifloxacin Other (See Comments)    Headache, muscle cramps  . Penicillins Hives and Itching  . Phenergan [Promethazine Hcl] Other (See Comments)    headache  . Shellfish Allergy Itching and Swelling  . Sulfa Antibiotics  Other (See Comments)    Stomach upset  . Tetracyclines & Related Other (See Comments)    Stomach bleed, head ache  . Tetracyclines & Related Other (See Comments)    Upset stomach and headache   . Tylenol [Acetaminophen] Other (See Comments)    Headache   . Vicodin [Hydrocodone-Acetaminophen] Other (See Comments)    Messes with my kidneys, makes them burts  . Vioxx [Rofecoxib] Other (See Comments)    headache    Current Outpatient Prescriptions  Medication Sig Dispense Refill  . albuterol (PROVENTIL HFA;VENTOLIN HFA) 108 (90 BASE) MCG/ACT inhaler Inhale into the lungs every 6 (six) hours as needed for wheezing or shortness of breath.      . alprazolam (XANAX) 2 MG tablet Take 2 mg by mouth at bedtime as needed for sleep.      . budesonide-formoterol (SYMBICORT) 80-4.5 MCG/ACT inhaler Inhale 2 puffs into the lungs  2 (two) times daily.      Marland Kitchen lidocaine (LIDODERM) 5 % Place 2 patches onto the skin daily. Remove & Discard patch within 12 hours or as directed by MD      . lisinopril (PRINIVIL,ZESTRIL) 10 MG tablet Take 10 mg by mouth daily.      . nitroGLYCERIN (NITROSTAT) 0.4 MG SL tablet Place 0.4 mg under the tongue every 5 (five) minutes as needed for chest pain.      . ranitidine (ZANTAC) 150 MG capsule Take 150 mg by mouth 2 (two) times daily.      Marland Kitchen buPROPion (WELLBUTRIN) 75 MG tablet Take 75 mg by mouth 2 (two) times daily.      . clotrimazole (LOTRIMIN) 1 % cream Apply 1 application topically 2 (two) times daily.  60 g  2  . dicyclomine (BENTYL) 10 MG capsule Take 10 mg by mouth 4 (four) times daily -  before meals and at bedtime.      . fluconazole (DIFLUCAN) 200 MG tablet Take 1 tablet (200 mg total) by mouth every other day.  3 tablet  1  . rosuvastatin (CRESTOR) 5 MG tablet Take 5 mg by mouth daily.       No current facility-administered medications for this visit.    Review of Systems Review of Systems Constitutional: negative for fatigue.  Positive for weight loss. Respiratory: negative for cough and wheezing Cardiovascular: negative for chest pain, fatigue and palpitations Gastrointestinal: negative for abdominal pain and change in bowel habits Genitourinary:  Positive for vaginal discharge and itching. Integument/breast: positive for nipple discharge and laxity of skin in areas. Musculoskeletal:negative for myalgias Neurological: negative for gait problems and tremors Behavioral/Psych: negative for abusive relationship, depression Endocrine: negative for temperature intolerance     Blood pressure 134/82, pulse 67, temperature 98.4 F (36.9 C), height 5\' 7"  (1.702 m), weight 238 lb (107.956 kg).  Physical Exam Physical Exam General:   alert  Skin:   Laxity in areas.  Jock itch type rash.  Lungs:   clear to auscultation bilaterally  Heart:   regular rate and rhythm, S1, S2 normal,  no murmur, click, rub or gallop  Breasts:   normal without suspicious masses, skin or nipple changes or axillary nodes.  Positive tan colored nipple discharge.  Abdomen:  normal findings: no organomegaly, soft, non-tender and no hernia  Pelvis:  External genitalia:  Fungal Jock Itch type rash Urinary system: urethral meatus normal and bladder without fullness, nontender Vaginal: normal without tenderness, induration or masses.  Normal appearing discharge Cervix: normal appearance Adnexa: normal bimanual exam Uterus:  anteverted and non-tender, normal size      Data Reviewed Labs Notes  Assessment    Nipple discharge after trauma.  Bilateral breast tenderness. Candida vulvovaginitis and Jock Itch. Laxity of skin     Plan    [facility]erred to [facility] for evaluation of nipple discharge and breast tenderness  Clotrimazole cream and Diflucan Rx for Jock Itch and Candida Vulvovaginitis.  Referred to Plastic Surgery for evaluation of skin laxity.   Orders Placed This Encounter  Procedures  . WET PREP BY MOLECULAR PROBE  . MM Digital Diagnostic Bilat    CO-SIGN REQ  7/14    PF: 11/27/2013 [facility]    NO NEEDS   JB/ TRISHA    MEDICAID    Standing Status: Future     Number of Occurrences:      Standing Expiration Date: 07/31/2015    Order Specific Question:  Reason for Exam (SYMPTOM  OR DIAGNOSIS REQUIRED)    Answer:  Nipple discharge after trauma, right breast.  Tenderness, left breast.    Order Specific Question:  Is the patient pregnant?    Answer:  No    Order Specific Question:  Preferred imaging location?    Answer:  [facility]  . Ambulatory referral to Plastic Surgery    Referral Priority:  Routine    Referral Type:  Surgical    Referral Reason:  Specialty Services Required    Requested Specialty:  Plastic Surgery    Number of Visits Requested:  1   Meds ordered this encounter  Medications  . clotrimazole (LOTRIMIN) 1 % cream    Sig: Apply 1 application topically 2  (two) times daily.    Dispense:  60 g    Refill:  2  . fluconazole (DIFLUCAN) 200 MG tablet    Sig: Take 1 tablet (200 mg total) by mouth every other day.    Dispense:  3 tablet    Refill:  1        HARPER,CHARLES A 05/29/2014, 2:11 PM

## 2014-05-30 ENCOUNTER — Other Ambulatory Visit: Payer: Self-pay | Admitting: Obstetrics

## 2014-05-30 ENCOUNTER — Other Ambulatory Visit: Payer: Self-pay

## 2014-05-30 DIAGNOSIS — N6452 Nipple discharge: Secondary | ICD-10-CM

## 2014-05-30 LAB — WET PREP BY MOLECULAR PROBE
Candida species: NEGATIVE
GARDNERELLA VAGINALIS: NEGATIVE
Trichomonas vaginosis: NEGATIVE

## 2014-06-05 ENCOUNTER — Ambulatory Visit
Admission: RE | Admit: 2014-06-05 | Discharge: 2014-06-05 | Disposition: A | Discharge status: Home / Self Care | Payer: Medicaid Other | Source: Ambulatory Visit | Attending: Obstetrics | Admitting: Obstetrics

## 2014-06-05 DIAGNOSIS — N644 Mastodynia: Secondary | ICD-10-CM

## 2014-06-05 DIAGNOSIS — N6452 Nipple discharge: Secondary | ICD-10-CM

## 2014-06-06 ENCOUNTER — Ambulatory Visit: Payer: Medicaid Other | Admitting: Obstetrics

## 2014-06-18 ENCOUNTER — Ambulatory Visit (INDEPENDENT_AMBULATORY_CARE_PROVIDER_SITE_OTHER): Payer: Medicaid Other | Admitting: Internal Medicine

## 2014-06-18 ENCOUNTER — Encounter: Payer: Self-pay | Admitting: Internal Medicine

## 2014-06-18 VITALS — BP 104/60 | HR 80 | Temp 98.0°F | Ht 65.0 in | Wt 239.0 lb

## 2014-06-18 DIAGNOSIS — R059 Cough, unspecified: Secondary | ICD-10-CM

## 2014-06-18 DIAGNOSIS — R05 Cough: Secondary | ICD-10-CM

## 2014-06-18 DIAGNOSIS — J449 Chronic obstructive pulmonary disease, unspecified: Secondary | ICD-10-CM

## 2014-06-18 DIAGNOSIS — J4489 Other specified chronic obstructive pulmonary disease: Secondary | ICD-10-CM | POA: Diagnosis not present

## 2014-06-18 DIAGNOSIS — F17200 Nicotine dependence, unspecified, uncomplicated: Secondary | ICD-10-CM

## 2014-06-18 MED ORDER — IRBESARTAN 75 MG PO TABS: 75.0000 mg | ORAL_TABLET | Freq: Every day | ORAL | Status: DC

## 2014-06-18 NOTE — Patient Instructions (Signed)
Stay on symbicort Take 2 puffs first thing in am and then another 2 puffs about 12 hours later.   Stop lisinopril  Start avapro 75 mg one daily   Work on inhaler technique:  relax and gently blow all the way out then take a nice smooth deep breath back in, triggering the inhaler at same time you start breathing in.  Hold for up to 5 seconds if you can.  Rinse and gargle with water when done  Please see patient coordinator before you leave today  to schedule sinus CT   The key is to stop smoking completely before smoking completely stops you!   Please schedule a follow up office visit in 4 weeks, sooner if needed with pfts on return

## 2014-06-18 NOTE — Progress Notes (Signed)
   Subjective:    Patient ID: Alexandra Werner, female    DOB: 10/24/1951  MRN: [id number]  HPI  [age] yobf smoker onset asthma around 4011 with very poor ex intol worse with colds on freq treatments but since around 2000 needing maint rx singulair/ symbicort/advair [place] [facility] referred 06/18/2014 to Pulmonary clinic by Dr Julio Sickssei-Bonsu.  06/18/2014 [facility] office visit/ Abigal Choung / on ACEi Chief Complaint  Patient presents with  . Pulmonary Consult    Referred per Dr. Cloyd Stagerssei Bonsu. Pt c/o cough and SOB "always"- but has been "not normal" for the past yr- cough is prod with dark green to brown sputum.  She states that she gets SOB walking from room to room and "can't do steps".    green mucus x one year, not better on abx with multiple allergies to meds  No obvious other patterns in day to day or daytime variabilty or assoc   cp or chest tightness, subjective wheeze overt sinus or hb symptoms. No unusual exp hx or h/o childhood pna/ asthma or knowledge of premature birth.  Sleeping ok without nocturnal  or early am exacerbation  of respiratory  c/o's or need for noct saba. Also denies any obvious fluctuation of symptoms with weather or environmental changes or other aggravating or alleviating factors except as outlined above   Current Medications, Allergies, Complete Past Medical History, Past Surgical History, Family History, and Social History were reviewed in [facility] electronic medical record.             Review of Systems  Constitutional: Negative for fever, chills and unexpected weight change.  HENT: Negative for congestion, dental problem, ear pain, nosebleeds, postnasal drip, rhinorrhea, sinus pressure, sneezing, sore throat, trouble swallowing and voice change.   Eyes: Negative for visual disturbance.  Respiratory: Positive for cough and shortness of breath. Negative for choking.   Cardiovascular: Negative for chest pain and leg swelling.    Gastrointestinal: Negative for vomiting, abdominal pain and diarrhea.  Genitourinary: Negative for difficulty urinating.  Musculoskeletal: Negative for arthralgias.  Skin: Negative for rash.  Neurological: Negative for tremors, syncope and headaches.  Hematological: Does not bruise/bleed easily.       Objective:   Physical Exam   amb hoarse bf nad  Wt Readings from Last 3 Encounters:  06/18/14 239 lb (108.41 kg)  05/29/14 238 lb (107.956 kg)  12/06/13 257 lb (116.574 kg)      HEENT: nl dentition, turbinates, and orophanx. Nl external ear canals without cough reflex   NECK :  without JVD/Nodes/TM/ nl carotid upstrokes bilaterally   LUNGS: no acc muscle use, clear to A and P bilaterally without cough on insp or exp maneuvers   CV:  RRR  no s3 or murmur or increase in P2, no edema   ABD:  soft and nontender with nl excursion in the supine position. No bruits or organomegaly, bowel sounds nl  MS:  warm without deformities, calf tenderness, cyanosis or clubbing  SKIN: warm and dry without lesions    NEURO:  alert, approp, no deficits    CxR 03/09/14  No active cardiopulmonary disease      Assessment & Plan:

## 2014-06-19 DIAGNOSIS — F17200 Nicotine dependence, unspecified, uncomplicated: Secondary | ICD-10-CM | POA: Insufficient documentation

## 2014-06-19 DIAGNOSIS — J449 Chronic obstructive pulmonary disease, unspecified: Secondary | ICD-10-CM | POA: Insufficient documentation

## 2014-06-19 NOTE — Assessment & Plan Note (Signed)

## 2014-06-19 NOTE — Assessment & Plan Note (Signed)
Symptoms are markedly disproportionate to objective findings and not clear this is a lung problem but pt does appear to have difficult airway management issues. DDX of  difficult airways management all start with A and  include Adherence, Ace Inhibitors, Acid Reflux, Active Sinus Disease, Alpha 1 Antitripsin deficiency, Anxiety masquerading as Airways dz,  ABPA,  allergy(esp in young), Aspiration (esp in elderly), Adverse effects of DPI,  Active smokers, plus two Bs  = Bronchiectasis and Beta blocker use..and one C= CHF  Adherence is always the initial "prime suspect" and is a multilayered concern that requires a "trust but verify" approach in every patient - starting with knowing how to use medications, especially inhalers, correctly, keeping up with refills and understanding the fundamental difference between maintenance and prns vs those medications only taken for a very short course and then stopped and not refilled.  The proper method of use, as well as anticipated side effects, of a metered-dose inhaler are discussed and demonstrated to the patient. Improved effectiveness after extensive coaching during this visit to a level of approximately  75% > continue symbicort  ? Active sinus dz > check sinus ct  ? ACEi related > try off   ? Allergy/ ? gerd > address off acei  Active smoking > discuss sep

## 2014-06-19 NOTE — Assessment & Plan Note (Addendum)
The most common causes of chronic cough in immunocompetent adults include the following: upper airway cough syndrome (UACS), previously referred to as postnasal drip syndrome (PNDS), which is caused by variety of rhinosinus conditions; (2) asthma; (3) GERD; (4) chronic bronchitis from cigarette smoking or other inhaled environmental irritants; (5) nonasthmatic eosinophilic bronchitis; and (6) bronchiectasis.   These conditions, singly or in combination, have accounted for up to 94% of the causes of chronic cough in prospective studies.   Other conditions have constituted no >6% of the causes in prospective studies These have included bronchogenic carcinoma, chronic interstitial pneumonia, sarcoidosis, left ventricular failure, ACEI-induced cough, and aspiration from a condition associated with pharyngeal dysfunction.    Chronic cough is often simultaneously caused by more than one condition. A single cause has been found from 38 to 82% of the time, multiple causes from 18 to 62%. Multiply caused cough has been the result of three diseases up to 42% of the time.       Based on hx and exam, this is most likely:  Classic Upper airway cough syndrome, so named because it's frequently impossible to sort out how much is  CR/sinusitis with freq throat clearing (which can be related to primary GERD)   vs  causing  secondary (" extra esophageal")  GERD from wide swings in gastric pressure that occur with throat clearing, often  promoting self use of mint and menthol lozenges that reduce the lower esophageal sphincter tone and exacerbate the problem further in a cyclical fashion.   These are the same pts (now being labeled as having "irritable larynx syndrome" by some cough centers) who not infrequently have a history of having failed to tolerate ace inhibitors,  dry powder inhalers or biphosphonates or report having atypical reflux symptoms that don't respond to standard doses of PPI , and are easily confused as  having aecopd or asthma flares by even experienced allergists/ pulmonologists.     Try off acei first and eval with sinus ct  and then regroup in 4  Weeks with pfts

## 2014-06-20 ENCOUNTER — Telehealth: Payer: Self-pay | Admitting: *Deleted

## 2014-06-20 MED ORDER — LOSARTAN POTASSIUM 100 MG PO TABS: 100.0000 mg | ORAL_TABLET | Freq: Every day | ORAL | Status: AC

## 2014-06-20 NOTE — Telephone Encounter (Signed)
Try alternative diovan 160 generic or micardis 80 and last choice losartan 100

## 2014-06-20 NOTE — Telephone Encounter (Signed)
irbesartan 75 mg is requiring PA Phone # [phone] ID # [id number] [place] medicaid  Please advise MW if you would like for us to start this? Thanks  Allergies  Allergen Reactions  . Acetaminophen Nausea And Vomiting  . Aspirin Nausea And Vomiting and Other (See Comments)    Stomach bleeds  . Azithromycin Nausea And Vomiting and Other (See Comments)    Stomach bleeding  . Benadryl [Diphenhydramine Hcl] Itching and Other (See Comments)    Headache  . Ciprofloxacin Other (See Comments)    Upsets stomach / headache  . Dairy Aid [Lactase] Other (See Comments)    Stomach upset  . Dilaudid [Hydromorphone Hcl] Swelling and Other (See Comments)    bruises   . Doxycycline Other (See Comments)    headache  . Fentanyl Hives and Other (See Comments)    Burned skin   . Glimepiride Other (See Comments)    Stomach upset  . Glipizide Hives and Other (See Comments)    Headaches, cramps  . Invokana [Canagliflozin]     Makes patient fall asleep and causes pain in her kidneys.   Alma Friendly. Januvia [Sitagliptin] Other (See Comments)    Severe headache   . Levofloxacin Itching and Other (See Comments)    Severe headache   . Meloxicam Other (See Comments)    hallucinations   . Metformin And Related Other (See Comments)    Upset stomach  . Morphine And Related Other (See Comments)    Give slowly or will get a headaahe  . Motrin [Ibuprofen] Nausea And Vomiting and Other (See Comments)    Stomach bleed  . Moxifloxacin Other (See Comments)    Headache, muscle cramps  . Penicillins Hives and Itching  . Phenergan [Promethazine Hcl] Other (See Comments)    headache  . Shellfish Allergy Itching and Swelling  . Sulfa Antibiotics Other (See Comments)    Stomach upset  . Tetracyclines & Related Other (See Comments)    Stomach bleed, head ache  . Tetracyclines & Related Other (See Comments)    Upset stomach and headache   . Tylenol [Acetaminophen] Other (See Comments)    Headache   . Vicodin  [Hydrocodone-Acetaminophen] Other (See Comments)    Messes with my kidneys, makes them burts  . Vioxx [Rofecoxib] Other (See Comments)    headache

## 2014-06-20 NOTE — Telephone Encounter (Signed)
Spoke with Kimber-pharmacist at [facility] Losartan 100mg  is covered by insurance with  $3.00 co-pay Verbal order given  lmtcb for pt x 1

## 2014-06-21 NOTE — Telephone Encounter (Signed)
Pt returned call. Pt aware of med change, Nothing further needed at this time.

## 2014-06-22 ENCOUNTER — Ambulatory Visit (INDEPENDENT_AMBULATORY_CARE_PROVIDER_SITE_OTHER)
Admission: RE | Admit: 2014-06-22 | Discharge: 2014-06-22 | Disposition: A | Discharge status: Home / Self Care | Payer: Medicaid Other | Source: Ambulatory Visit | Attending: Internal Medicine | Admitting: Internal Medicine

## 2014-06-22 DIAGNOSIS — R059 Cough, unspecified: Secondary | ICD-10-CM

## 2014-06-22 DIAGNOSIS — R05 Cough: Secondary | ICD-10-CM

## 2014-06-24 ENCOUNTER — Encounter: Payer: Self-pay | Admitting: Internal Medicine

## 2014-06-26 ENCOUNTER — Ambulatory Visit: Payer: Medicaid Other | Admitting: Obstetrics

## 2014-07-16 ENCOUNTER — Other Ambulatory Visit: Payer: Self-pay | Admitting: Internal Medicine

## 2014-07-16 DIAGNOSIS — J449 Chronic obstructive pulmonary disease, unspecified: Secondary | ICD-10-CM

## 2014-07-17 ENCOUNTER — Ambulatory Visit: Payer: Medicaid Other | Admitting: Internal Medicine

## 2014-08-10 ENCOUNTER — Encounter: Payer: Self-pay | Admitting: Internal Medicine

## 2014-08-17 ENCOUNTER — Ambulatory Visit: Payer: Medicaid Other | Admitting: Internal Medicine

## 2014-08-17 DIAGNOSIS — R05 Cough: Secondary | ICD-10-CM

## 2014-09-17 ENCOUNTER — Encounter: Payer: Self-pay | Admitting: Internal Medicine

## 2014-09-25 ENCOUNTER — Emergency Department (HOSPITAL_COMMUNITY): Payer: Medicaid Other

## 2014-09-25 ENCOUNTER — Encounter (HOSPITAL_COMMUNITY): Payer: Self-pay | Admitting: *Deleted

## 2014-09-25 ENCOUNTER — Emergency Department (HOSPITAL_COMMUNITY)
Admission: EM | Admit: 2014-09-25 | Discharge: 2014-09-26 | Disposition: A | Discharge status: Home / Self Care | Payer: Medicaid Other | Attending: Emergency Medicine | Admitting: Emergency Medicine

## 2014-09-25 DIAGNOSIS — R0789 Other chest pain: Secondary | ICD-10-CM | POA: Insufficient documentation

## 2014-09-25 DIAGNOSIS — G8929 Other chronic pain: Secondary | ICD-10-CM | POA: Insufficient documentation

## 2014-09-25 DIAGNOSIS — R197 Diarrhea, unspecified: Secondary | ICD-10-CM | POA: Insufficient documentation

## 2014-09-25 DIAGNOSIS — I10 Essential (primary) hypertension: Secondary | ICD-10-CM | POA: Diagnosis not present

## 2014-09-25 DIAGNOSIS — I509 Heart failure, unspecified: Secondary | ICD-10-CM | POA: Insufficient documentation

## 2014-09-25 DIAGNOSIS — Z9049 Acquired absence of other specified parts of digestive tract: Secondary | ICD-10-CM | POA: Diagnosis not present

## 2014-09-25 DIAGNOSIS — Z720 Tobacco use: Secondary | ICD-10-CM | POA: Insufficient documentation

## 2014-09-25 DIAGNOSIS — R079 Chest pain, unspecified: Secondary | ICD-10-CM

## 2014-09-25 DIAGNOSIS — R112 Nausea with vomiting, unspecified: Secondary | ICD-10-CM | POA: Diagnosis not present

## 2014-09-25 DIAGNOSIS — E119 Type 2 diabetes mellitus without complications: Secondary | ICD-10-CM | POA: Diagnosis not present

## 2014-09-25 DIAGNOSIS — Z7951 Long term (current) use of inhaled steroids: Secondary | ICD-10-CM | POA: Insufficient documentation

## 2014-09-25 DIAGNOSIS — I252 Old myocardial infarction: Secondary | ICD-10-CM | POA: Diagnosis not present

## 2014-09-25 DIAGNOSIS — Z79899 Other long term (current) drug therapy: Secondary | ICD-10-CM | POA: Diagnosis not present

## 2014-09-25 DIAGNOSIS — J45909 Unspecified asthma, uncomplicated: Secondary | ICD-10-CM | POA: Diagnosis not present

## 2014-09-25 DIAGNOSIS — Z9851 Tubal ligation status: Secondary | ICD-10-CM | POA: Diagnosis not present

## 2014-09-25 DIAGNOSIS — R1110 Vomiting, unspecified: Secondary | ICD-10-CM

## 2014-09-25 DIAGNOSIS — M1990 Unspecified osteoarthritis, unspecified site: Secondary | ICD-10-CM | POA: Diagnosis not present

## 2014-09-25 DIAGNOSIS — R109 Unspecified abdominal pain: Secondary | ICD-10-CM | POA: Diagnosis present

## 2014-09-25 LAB — COMPREHENSIVE METABOLIC PANEL
ALBUMIN: 3.4 g/dL — AB (ref 3.5–5.2)
ALT: 12 U/L (ref 0–35)
AST: 17 U/L (ref 0–37)
Alkaline Phosphatase: 83 U/L (ref 39–117)
Anion gap: 14 (ref 5–15)
BUN: 9 mg/dL (ref 6–23)
CALCIUM: 9.1 mg/dL (ref 8.4–10.5)
CO2: 23 mEq/L (ref 19–32)
CREATININE: 0.7 mg/dL (ref 0.50–1.10)
Chloride: 102 mEq/L (ref 96–112)
GFR calc Af Amer: >90 mL/min (ref >90)
GFR calc non Af Amer: >90 mL/min (ref >90)
Glucose, Bld: 354 mg/dL — ABNORMAL HIGH (ref 70–99)
Potassium: 4.1 mEq/L (ref 3.7–5.3)
Sodium: 139 mEq/L (ref 137–147)
TOTAL PROTEIN: 6.9 g/dL (ref 6.0–8.3)
Total Bilirubin: 0.2 mg/dL — ABNORMAL LOW (ref 0.3–1.2)

## 2014-09-25 LAB — URINE MICROSCOPIC-ADD ON

## 2014-09-25 LAB — TROPONIN I: Troponin I: <0.3 ng/mL (ref <0.30)

## 2014-09-25 LAB — CBC WITH DIFFERENTIAL/PLATELET
BASOS ABS: 0 10*3/uL (ref 0.0–0.1)
BASOS PCT: 0 % (ref 0–1)
EOS ABS: 0.1 10*3/uL (ref 0.0–0.7)
Eosinophils Relative: 1 % (ref 0–5)
HCT: 42.6 % (ref 36.0–46.0)
Hemoglobin: 14.6 g/dL (ref 12.0–15.0)
Lymphocytes Relative: 48 % — ABNORMAL HIGH (ref 12–46)
Lymphs Abs: 2.9 10*3/uL (ref 0.7–4.0)
MCH: 29.1 pg (ref 26.0–34.0)
MCHC: 34.3 g/dL (ref 30.0–36.0)
MCV: 84.9 fL (ref 78.0–100.0)
MONO ABS: 0.4 10*3/uL (ref 0.1–1.0)
Monocytes Relative: 6 % (ref 3–12)
NEUTROS ABS: 2.8 10*3/uL (ref 1.7–7.7)
Neutrophils Relative %: 45 % (ref 43–77)
Platelets: 174 10*3/uL (ref 150–400)
RBC: 5.02 MIL/uL (ref 3.87–5.11)
RDW: 13.6 % (ref 11.5–15.5)
WBC: 6.2 10*3/uL (ref 4.0–10.5)

## 2014-09-25 LAB — CBG MONITORING, ED: GLUCOSE-CAPILLARY: 243 mg/dL — AB (ref 70–99)

## 2014-09-25 LAB — URINALYSIS, ROUTINE W REFLEX MICROSCOPIC
Bilirubin Urine: NEGATIVE
Hgb urine dipstick: NEGATIVE
Ketones, ur: NEGATIVE mg/dL
LEUKOCYTES UA: NEGATIVE
Nitrite: NEGATIVE
PH: 5.5 (ref 5.0–8.0)
Protein, ur: NEGATIVE mg/dL
SPECIFIC GRAVITY, URINE: 1.037 — AB (ref 1.005–1.030)
Urobilinogen, UA: 0.2 mg/dL (ref 0.0–1.0)

## 2014-09-25 MED ORDER — SODIUM CHLORIDE 0.9 % IV BOLUS (SEPSIS): 1000.0000 mL | Freq: Once | INTRAVENOUS | Status: DC

## 2014-09-25 MED ORDER — ONDANSETRON HCL 4 MG/2ML IJ SOLN
4.0000 mg | Freq: Once | INTRAMUSCULAR | Status: AC
  Administered 2014-09-25: 4 mg via INTRAVENOUS
  Filled 2014-09-25: qty 2

## 2014-09-25 MED ORDER — ONDANSETRON 4 MG PO TBDP: 4.0000 mg | ORAL_TABLET | Freq: Three times a day (TID) | ORAL | Status: AC | PRN

## 2014-09-25 MED ORDER — SODIUM CHLORIDE 0.9 % IV BOLUS (SEPSIS)
1000.0000 mL | Freq: Once | INTRAVENOUS | Status: AC
  Administered 2014-09-25: 1000 mL via INTRAVENOUS

## 2014-09-25 MED ORDER — PROMETHAZINE HCL 25 MG PO TABS
25.0000 mg | ORAL_TABLET | Freq: Once | ORAL | Status: AC
  Administered 2014-09-25: 25 mg via ORAL
  Filled 2014-09-25: qty 1

## 2014-09-25 MED ORDER — OXYCODONE HCL 5 MG PO TABS
5.0000 mg | ORAL_TABLET | Freq: Once | ORAL | Status: AC
  Administered 2014-09-26: 5 mg via ORAL
  Filled 2014-09-25: qty 1

## 2014-09-25 MED ORDER — PANTOPRAZOLE SODIUM 40 MG PO TBEC
40.0000 mg | DELAYED_RELEASE_TABLET | Freq: Once | ORAL | Status: AC
  Administered 2014-09-25: 40 mg via ORAL
  Filled 2014-09-25: qty 1

## 2014-09-25 MED ORDER — GI COCKTAIL ~~LOC~~
30.0000 mL | Freq: Once | ORAL | Status: AC
  Administered 2014-09-25: 30 mL via ORAL
  Filled 2014-09-25: qty 30

## 2014-09-25 MED ORDER — ONDANSETRON HCL 4 MG PO TABS
8.0000 mg | ORAL_TABLET | Freq: Once | ORAL | Status: AC
  Administered 2014-09-25: 8 mg via ORAL
  Filled 2014-09-25: qty 2

## 2014-09-25 NOTE — ED Notes (Signed)
Ate chinese today; pt.states, "metallic taste/smell." been vomiting since eating chinese.

## 2014-09-25 NOTE — ED Notes (Signed)
CBG 243 

## 2014-09-25 NOTE — ED Notes (Signed)
Iv team nurse at bedside  

## 2014-09-25 NOTE — Discharge Instructions (Signed)
Possible Food Poisoning Food poisoning is an illness caused by something you ate or drank. There are over 250 known causes of food poisoning. However, many other causes are unknown.You can be treated even if the exact cause of your food poisoning is not known. In most cases, food poisoning is mild and lasts 1 to 2 days. However, some cases can be serious, especially for people with low immune systems, the elderly, children and infants, and pregnant women. CAUSES  Poor personal hygiene, improper cleaning of storage and preparation areas, and unclean utensils can cause infection or tainting (contamination) of foods. The causes of food poisoning are numerous.Infectious agents, such as viruses, bacteria, or parasites, can cause harm by infecting the intestine and disrupting the absorption of nutrients and water. This can cause diarrhea and lead to dehydration. Viruses are responsible for most of the food poisonings in which an agent is found. Parasites are less likely to cause food poisoning. Toxic agents, such as poisonous mushrooms, marine algae, and pesticides can also cause food poisoning.  Viral causes of food poisoning include:  Norovirus.  Rotavirus.  Hepatitis A.  Bacterial causes of food poisoning include:  Salmonellae.  Campylobacter.  Bacillus cereus.  Escherichia coli (E. coli).  Shigella.  Listeria monocytogenes.  Clostridium botulinum (botulism).  Vibrio cholerae.  Parasites that can cause food poisoning include:  Giardia.  Cryptosporidium.  Toxoplasma. SYMPTOMS Symptoms may appear several hours or longer after consuming the contaminated food or drink. Symptoms may include:  Nausea.  Vomiting.  Cramping.  Diarrhea.  Fever and chills.  Muscle aches. DIAGNOSIS Your health care provider may be able to diagnose food poisoning from a list of what you have recently eaten and results from lab tests. Diagnostic tests may include an exam of the  feces. TREATMENT In most cases, treatment focuses on helping to relieve your symptoms and staying well hydrated. Antibiotic medicines are rarely needed. In severe cases, hospitalization may be required. HOME CARE INSTRUCTIONS   Drink enough water and fluids to keep your urine clear or pale yellow. Drink small amounts of fluids frequently and increase as tolerated.  Ask your health care provider for specific rehydration instructions.  Avoid:  Foods high in sugar.  Alcohol.  Carbonated drinks.  Tobacco.  Juice.  Caffeine drinks.  Extremely hot or cold fluids.  Fatty, greasy foods.  Too much intake of anything at one time.  Dairy products until 24 to 48 hours after diarrhea stops.  You may consume probiotics. Probiotics are active cultures of beneficial bacteria. They may lessen the amount and number of diarrheal stools in adults. Probiotics can be found in yogurt with active cultures and in supplements.  Wash your hands well to avoid spreading the bacteria.  Take medicines only as directed by your health care provider. Do not give your child aspirin because of the association with Reye's syndrome.  Ask your health care provider if you should continue to take your regular prescribed and over-the-counter medicines. PREVENTION   Wash your hands, food preparation surfaces, and utensils thoroughly before and after handling raw foods.  Keep refrigerated foods below 71F (5C).  Serve hot foods immediately or keep them heated above 171F (60C).  Divide large volumes of food into small portions for rapid cooling in the refrigerator. Hot, bulky foods in the refrigerator can raise the temperature of other foods that have already cooled.  Follow approved canning procedures.  Heat canned foods thoroughly before tasting.  When in doubt, throw it out.  Infants, the elderly,  women who are pregnant, and people with compromised immune systems are especially susceptible to food  poisoning. These people should never consume unpasteurized cheese, unpasteurized cider, raw fish, raw seafood, or raw meat-type products. SEEK IMMEDIATE MEDICAL CARE IF:   You have difficulty breathing, swallowing, talking, or moving.  You develop blurred vision.  You are unable to keep fluids down.  You faint or nearly faint.  Your eyes turn yellow.  Vomiting or diarrhea develops or becomes persistent.  Abdominal pain develops, increases, or localizes in one small area.  You have a fever.  The diarrhea becomes excessive or contains blood or mucus.  You develop excessive weakness, dizziness, or extreme thirst.  You have no urine for 8 hours. MAKE SURE YOU:   Understand these instructions.  Will watch your condition.  Will get help right away if you are not doing well or get worse. Document Released: 07/31/2004 Document Revised: 03/19/2014 Document Reviewed: 03/19/2011 [facility] Patient Information 2015 [place]. This information is not intended to replace advice given to you by your health care provider. Make sure you discuss any questions you have with your health care provider.

## 2014-09-25 NOTE — ED Provider Notes (Signed)
TIME SEEN: 8:40 PM  CHIEF COMPLAINT: burning abdominal pain, chest pain, vomiting  HPI: Pt is a 63 y.o. F with history of diabetes, CHF, hypertension, hiatal hernia who presents to the emergency department with complaints of burning chest and abdominal pain that started after vomiting which started after eating which she suspects was bad [place] food earlier at lunch today. She reports that she ate rice at a restaurant and states it tasted bad and had a "metallic taste". She states the proximal was 30 minutes to one hour later she began vomiting. She has had some mild diarrhea but no bloody stools or melena. She developed diffuse abdominal burning and burning in her chest afterwards but no shortness of breath. No one else ate similar food. She denies sick contacts or recent travel. No fevers or chills.  ROS: See HPI Constitutional: no fever  Eyes: no drainage  ENT: no runny nose   Cardiovascular:  chest pain  Resp: no SOB  GI:  vomiting GU: no dysuria Integumentary: no rash  Allergy: no hives  Musculoskeletal: no leg swelling  Neurological: no slurred speech ROS otherwise negative  PAST MEDICAL HISTORY/PAST SURGICAL HISTORY:  Past Medical History  Diagnosis Date  . Asthma   . Hiatal hernia   . Diabetes mellitus   . MI (myocardial infarction)   . GERD (gastroesophageal reflux disease)   . IBS (irritable bowel syndrome)   . Gastritis   . Arthritis   . Diabetes mellitus without complication   . CHF (congestive heart failure)   . Asthma   . Chronic pain   . Hypertension     MEDICATIONS:  Prior to Admission medications   Medication Sig Start Date End Date Taking? Authorizing Provider  albuterol (PROAIR HFA) 108 (90 BASE) MCG/ACT inhaler Inhale 1-2 puffs into the lungs every 6 (six) hours as needed for wheezing or shortness of breath.    Historical Provider, MD  alprazolam Prudy Feeler(XANAX) 2 MG tablet Take 2 mg by mouth at bedtime as needed for sleep.    Historical Provider, MD   budesonide-formoterol (SYMBICORT) 80-4.5 MCG/ACT inhaler Inhale 2 puffs into the lungs 2 (two) times daily.    Historical Provider, MD  buPROPion (WELLBUTRIN) 75 MG tablet Take 75 mg by mouth 3 (three) times daily.     Historical Provider, MD  DULoxetine (CYMBALTA) 30 MG capsule Take 30 mg by mouth daily.    Historical Provider, MD  fluticasone (FLONASE) 50 MCG/ACT nasal spray Place 1 spray into both nostrils daily.    Historical Provider, MD  gabapentin (NEURONTIN) 300 MG capsule Take 300 mg by mouth 3 (three) times daily.    Historical Provider, MD  irbesartan (AVAPRO) 75 MG tablet Take 1 tablet (75 mg total) by mouth daily. 06/18/14   Nyoka CowdenMichael B Wert, MD  losartan (COZAAR) 100 MG tablet Take 1 tablet (100 mg total) by mouth daily. 06/20/14   Nyoka CowdenMichael B Wert, MD  oxycodone (OXY-IR) 5 MG capsule Take 5 mg by mouth every 6 (six) hours as needed for pain.    Historical Provider, MD    ALLERGIES:  Allergies  Allergen Reactions  . Acetaminophen Nausea And Vomiting  . Aspirin Nausea And Vomiting and Other (See Comments)    Stomach bleeds  . Azithromycin Nausea And Vomiting and Other (See Comments)    Stomach bleeding  . Benadryl [Diphenhydramine Hcl] Itching and Other (See Comments)    Headache  . Ciprofloxacin Other (See Comments)    Upsets stomach / headache  . Dairy Aid [  Lactase] Other (See Comments)    Stomach upset  . Dilaudid [Hydromorphone Hcl] Swelling and Other (See Comments)    bruises   . Doxycycline Other (See Comments)    headache  . Fentanyl Hives and Other (See Comments)    Burned skin   . Glimepiride Other (See Comments)    Stomach upset  . Glipizide Hives and Other (See Comments)    Headaches, cramps  . Invokana [Canagliflozin]     Makes patient fall asleep and causes pain in her kidneys.   Alma Friendly. Januvia [Sitagliptin] Other (See Comments)    Severe headache   . Levofloxacin Itching and Other (See Comments)    Severe headache   . Meloxicam Other (See Comments)     hallucinations   . Metformin And Related Other (See Comments)    Upset stomach  . Morphine And Related Other (See Comments)    Give slowly or will get a headaahe  . Motrin [Ibuprofen] Nausea And Vomiting and Other (See Comments)    Stomach bleed  . Moxifloxacin Other (See Comments)    Headache, muscle cramps  . Penicillins Hives and Itching  . Phenergan [Promethazine Hcl] Other (See Comments)    headache  . Shellfish Allergy Itching and Swelling  . Sulfa Antibiotics Other (See Comments)    Stomach upset  . Tetracyclines & Related Other (See Comments)    Stomach bleed, head ache  . Tetracyclines & Related Other (See Comments)    Upset stomach and headache   . Tylenol [Acetaminophen] Other (See Comments)    Headache   . Vicodin [Hydrocodone-Acetaminophen] Other (See Comments)    Messes with my kidneys, makes them burts  . Vioxx [Rofecoxib] Other (See Comments)    headache    SOCIAL HISTORY:  History  Substance Use Topics  . Smoking status: Current Every Day Smoker -- 1.00 packs/day for 39 years    Types: Cigarettes  . Smokeless tobacco: Never Used  . Alcohol Use: No    FAMILY HISTORY: Family History  Problem Relation Age of Onset  . Uterine cancer Mother   . Stroke Father   . Stroke Sister   . Heart disease Brother   . Diabetes Son   . Asthma Mother   . Allergies Mother     EXAM: BP 149/72 mmHg  Pulse 87  Temp(Src) 98.6 F (37 C)  Resp 18  SpO2 97% CONSTITUTIONAL: Alert and oriented and responds appropriately to questions. Well-appearing; well-nourished HEAD: Normocephalic EYES: Conjunctivae clear, PERRL ENT: normal nose; no rhinorrhea; moist mucous membranes; pharynx without lesions noted NECK: Supple, no meningismus, no LAD  CARD: RRR; S1 and S2 appreciated; no murmurs, no clicks, no rubs, no gallops RESP: Normal chest excursion without splinting or tachypnea; breath sounds clear and equal bilaterally; no wheezes, no rhonchi, no rales,  ABD/GI: Normal  bowel sounds; non-distended; soft, non-tender, no rebound, no guarding BACK:  The back appears normal and is non-tender to palpation, there is no CVA tenderness EXT: Normal ROM in all joints; non-tender to palpation; no edema; normal capillary refill; no cyanosis    SKIN: Normal color for age and race; warm NEURO: Moves all extremities equally PSYCH: The patient's mood and manner are appropriate. Grooming and personal hygiene are appropriate.  MEDICAL DECISION MAKING: Pt here with possible bad food exposure. She is complaining of atypical chest pain but this may be secondary to vomiting.  She is a poor historian and given her comorbidities however, will obtain EKG, troponin and chest x-ray.  Her  labs are unremarkable other than an elevated glucose. We'll give IV fluids and reassess. Anion gap is 14, bicarbonate 23. We'll give IV Zofran, protonic, GI cocktail, Toradol and reassess.    ED PROGRESS: Patient reports feeling better. Blood glucose has improved. Troponin negative.  Acute abdominal series is unremarkable.  She is tolerating by mouth without further vomiting. She is requesting additional pain and nausea medication. She appears to have allergic reactions or adverse reactions to multiple medications but states she can take oxycodone and Phenergan. I feel she is safe to be discharged home. We'll discharge with prescription for Zofran. Her daughter is coming to pick her up. Discussed return precautions. She verbalizes understanding is comfortable with plan.      EKG Interpretation  Date/Time:  Tuesday September 25 2014 21:21:16 EST Ventricular Rate:  65 PR Interval:  156 QRS Duration: 75 QT Interval:  441 QTC Calculation: 459 R Axis:   34 Text Interpretation:  Sinus rhythm Low voltage, precordial leads Confirmed by Amiah Frohlich,  DO, Takeysha Bonk ([id number]) on 09/25/2014 9:25:50 PM        Layla Maw Khadir Roam, DO 09/25/14 [id number]

## 2014-11-30 ENCOUNTER — Encounter: Payer: Self-pay | Admitting: Internal Medicine

## 2015-11-16 IMAGING — CR DG CHEST 2V
2 series · 2 of 2 positions shown · non-contrast
Comparison: 11/10/2011

CLINICAL DATA: Chest pain, vomiting.

EXAM:
CHEST  2 VIEW

[w chest pa]
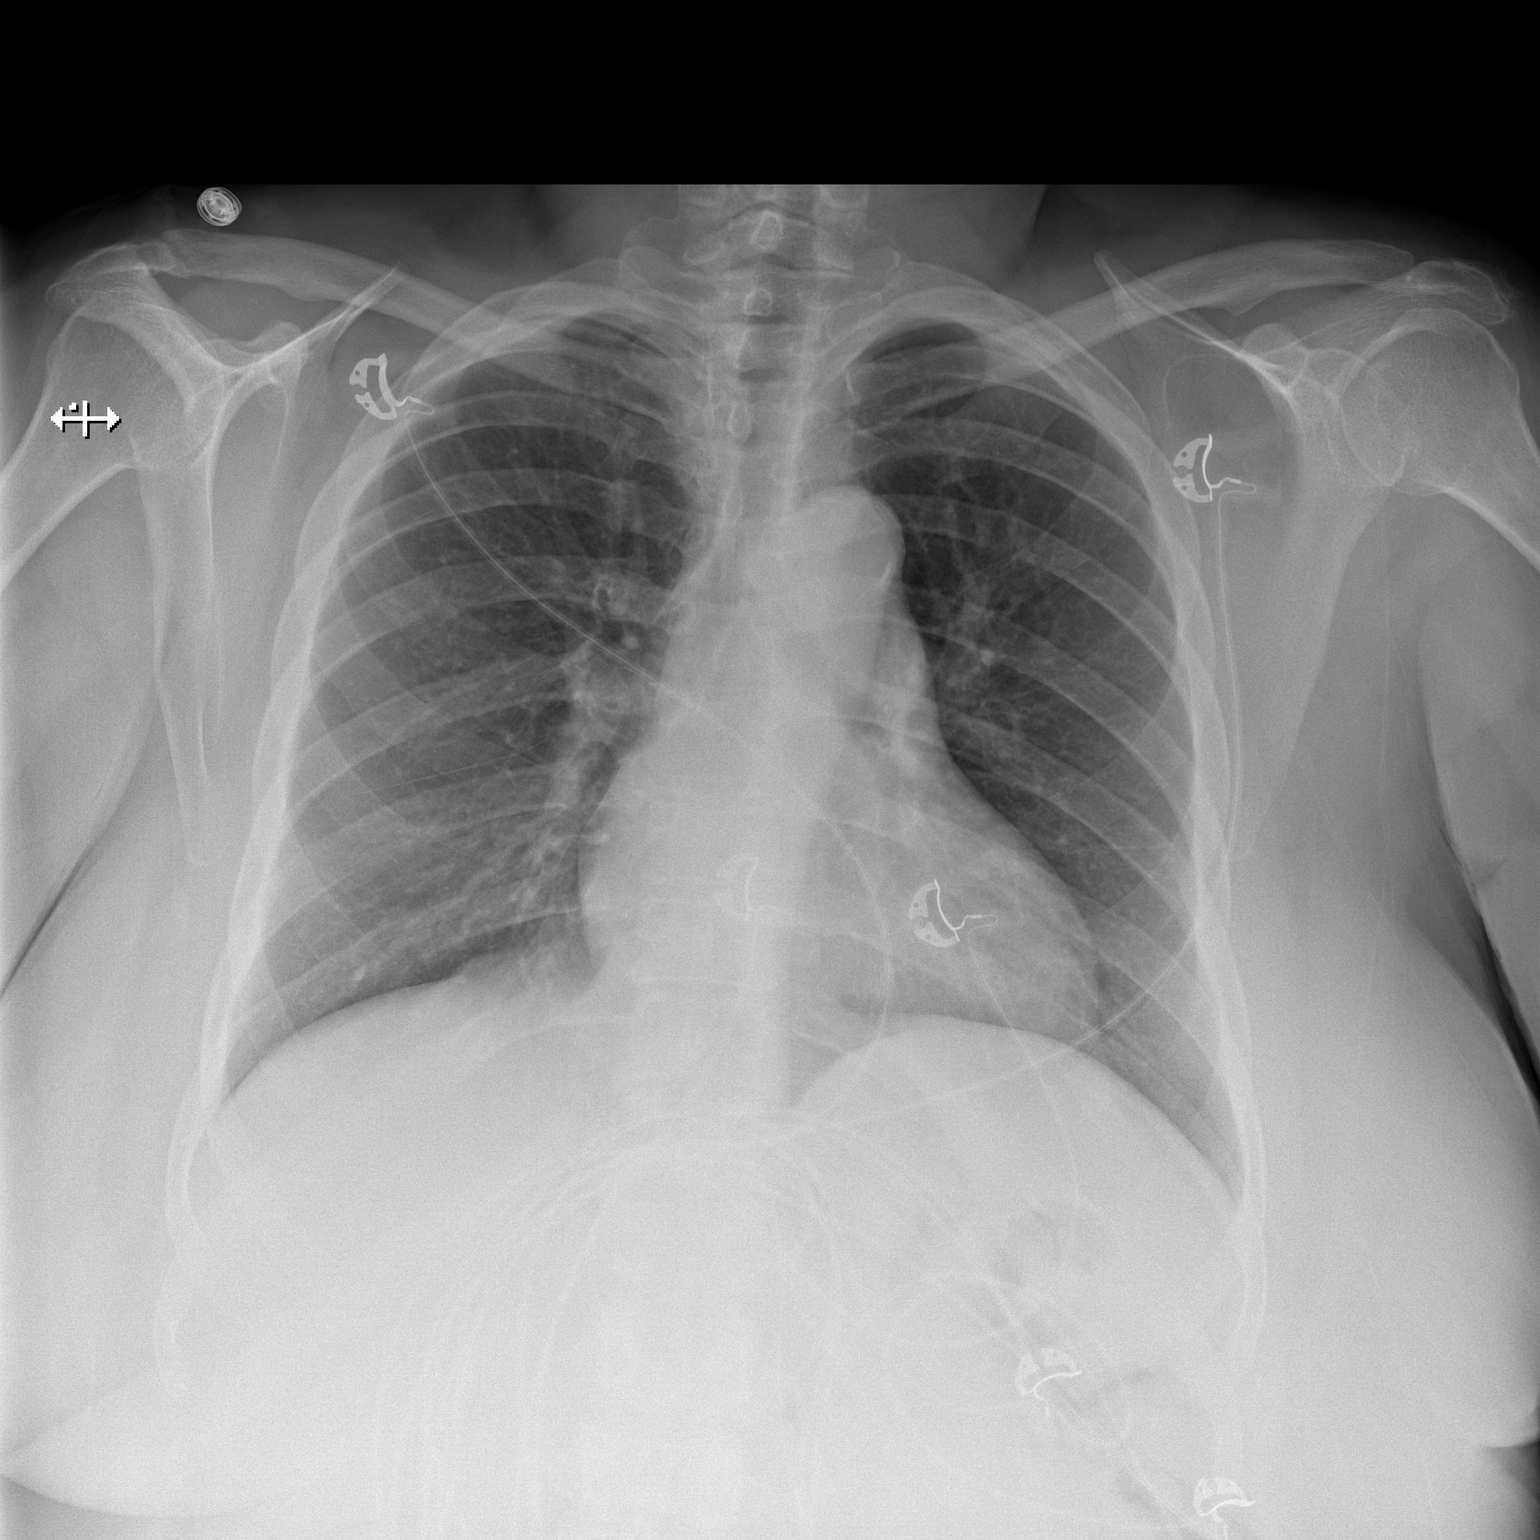

[w chest lat]
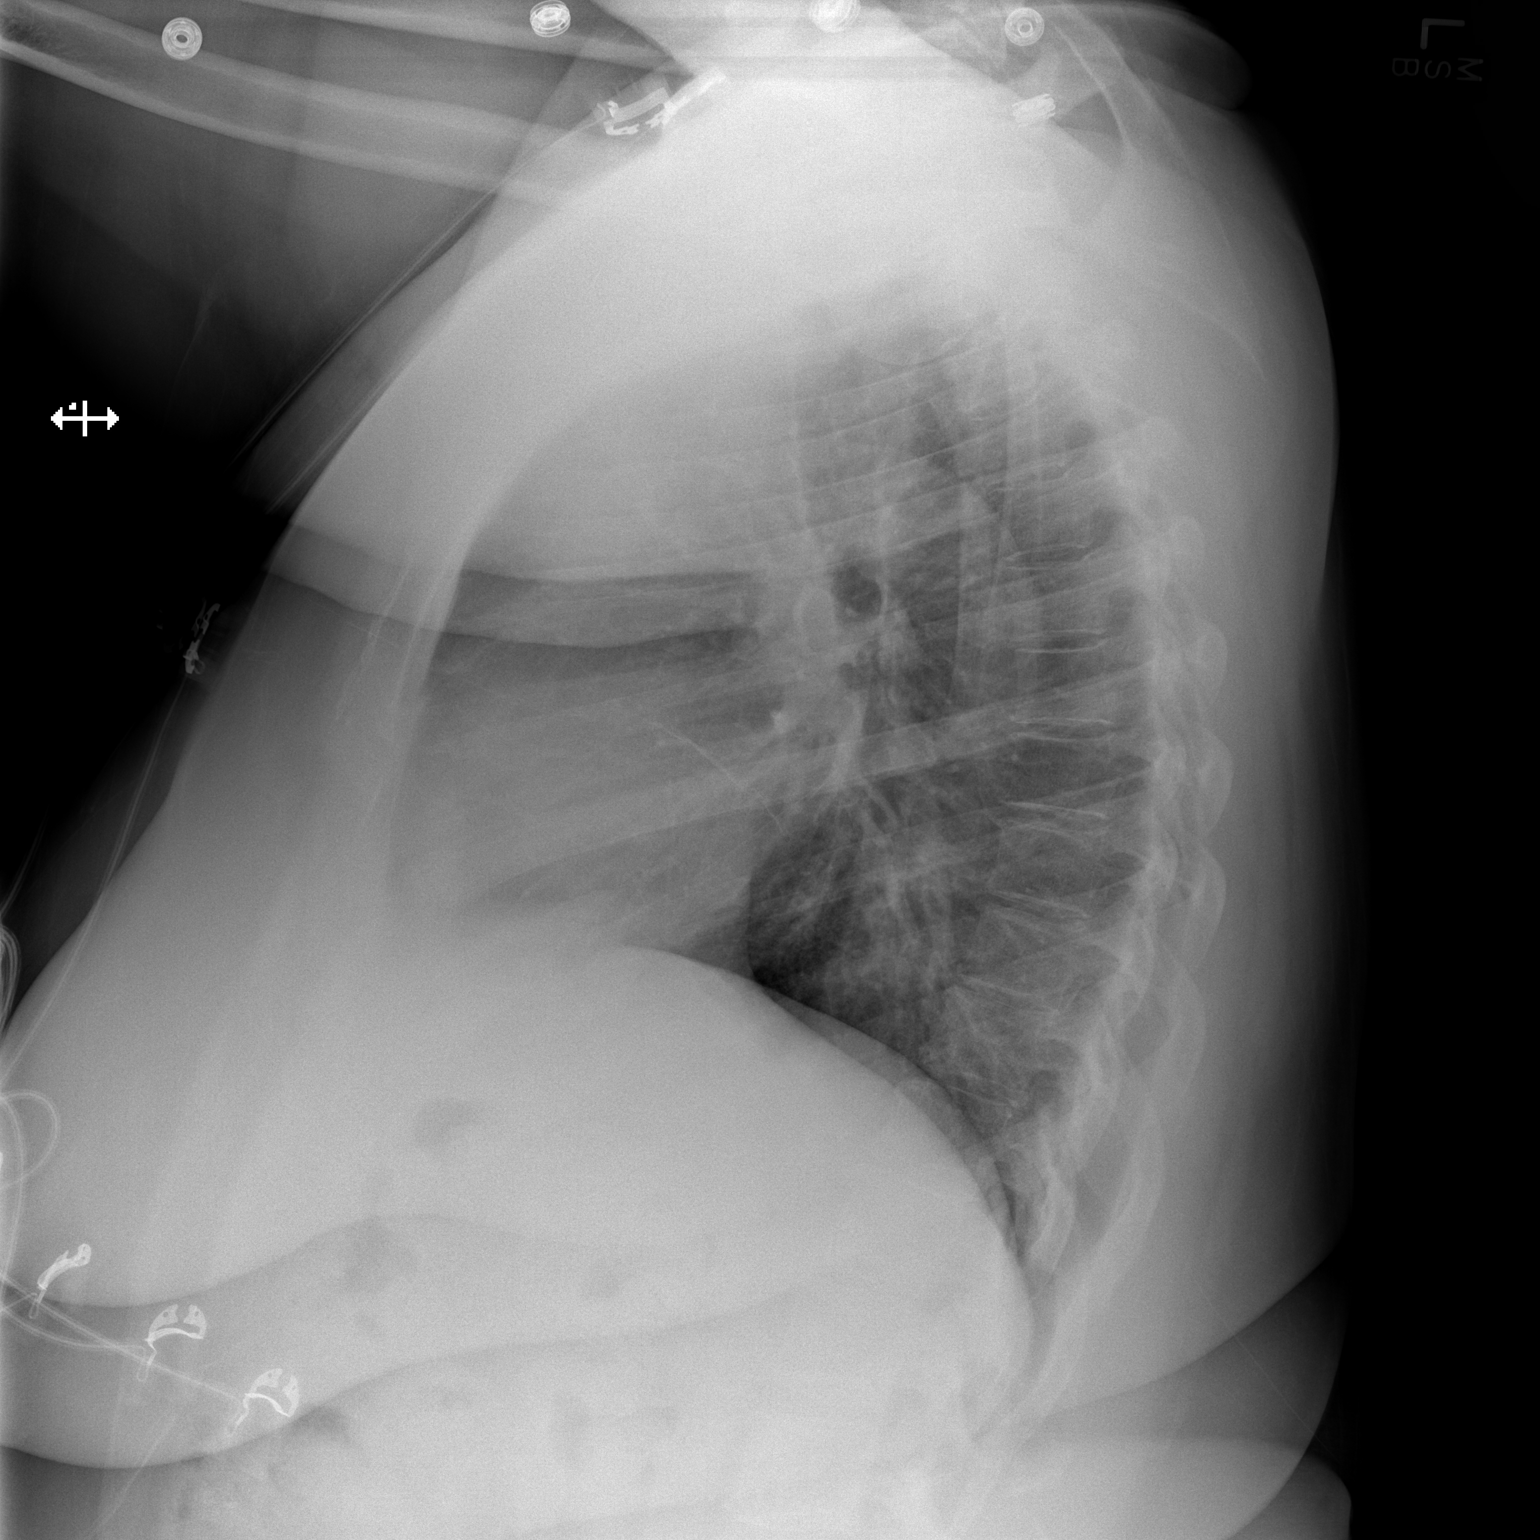

[2 of 2 positions shown; findings below may reference images not displayed]

FINDINGS: The heart size and mediastinal contours are within normal limits.
Both lungs are clear. The visualized skeletal structures are
unremarkable.
IMPRESSION: No active cardiopulmonary disease.

## 2016-10-06 IMAGING — CR DG ABDOMEN ACUTE W/ 1V CHEST
4 series · 4 of 4 positions shown · non-contrast
Comparison: Chest radiograph March 09, 2014 ; acute abdominal
series November 08, 2012

CLINICAL DATA: Abdominal pain and nausea.

EXAM:
ACUTE ABDOMEN SERIES (ABDOMEN 2 VIEW & CHEST 1 VIEW)

[w chest pa]
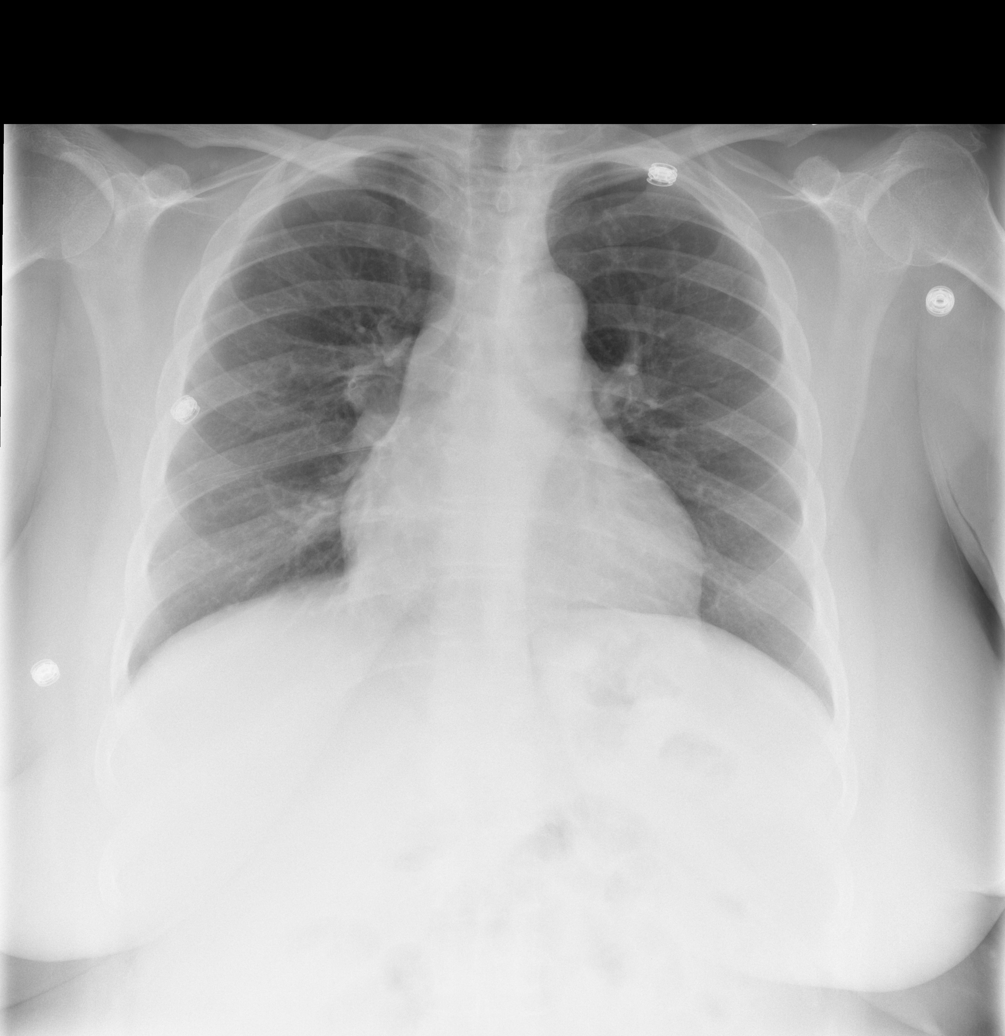

[w abdomen upright *]
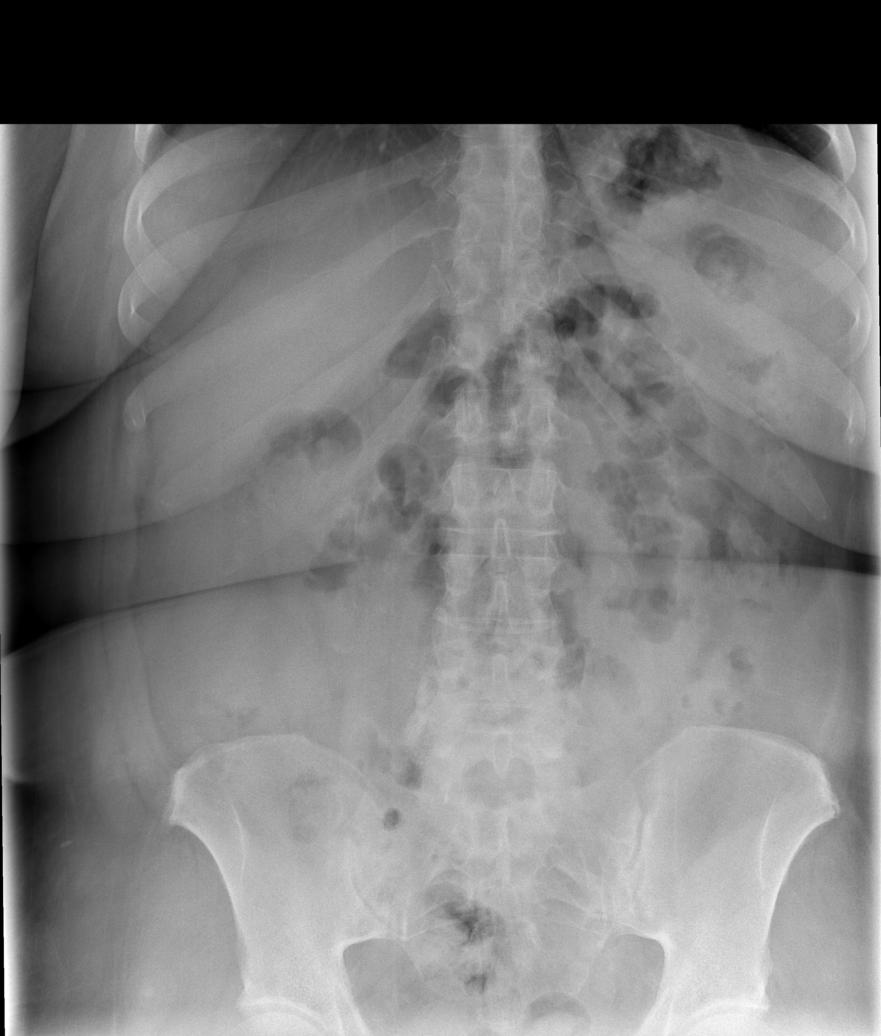

[t abdomen supine *]
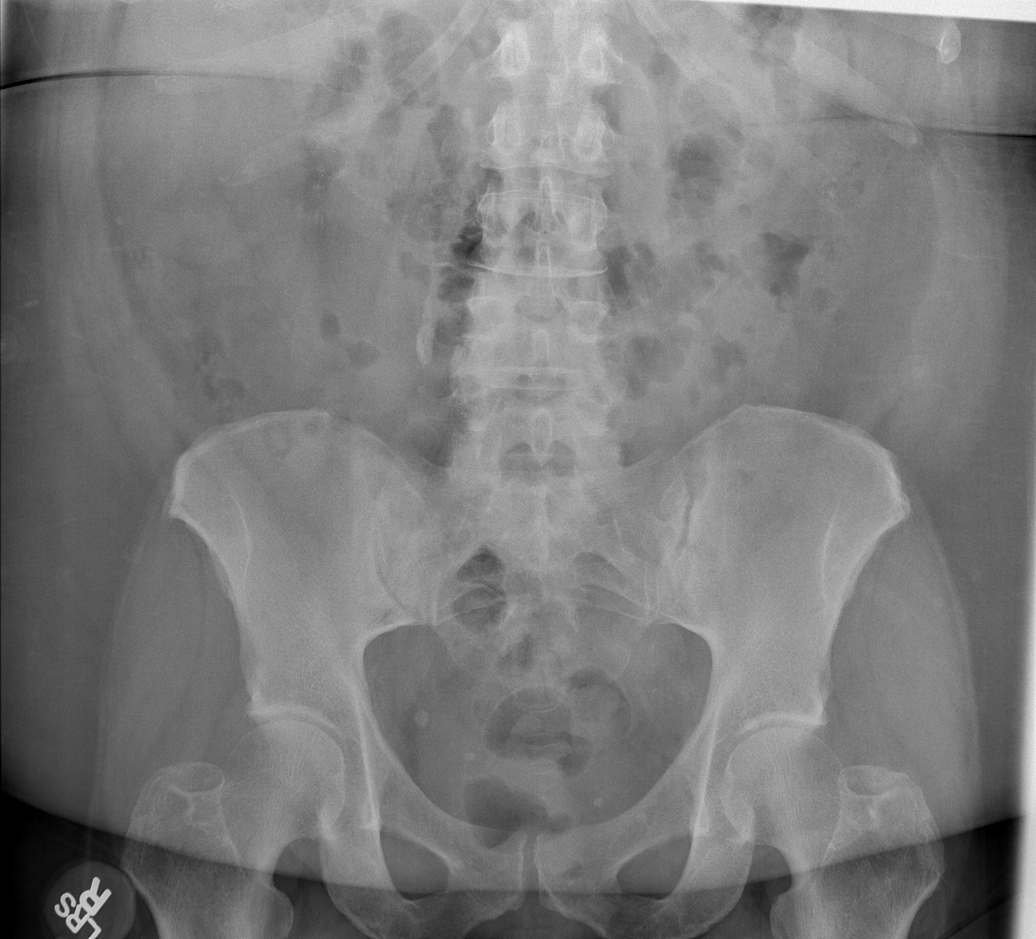

[t abdomen supine]
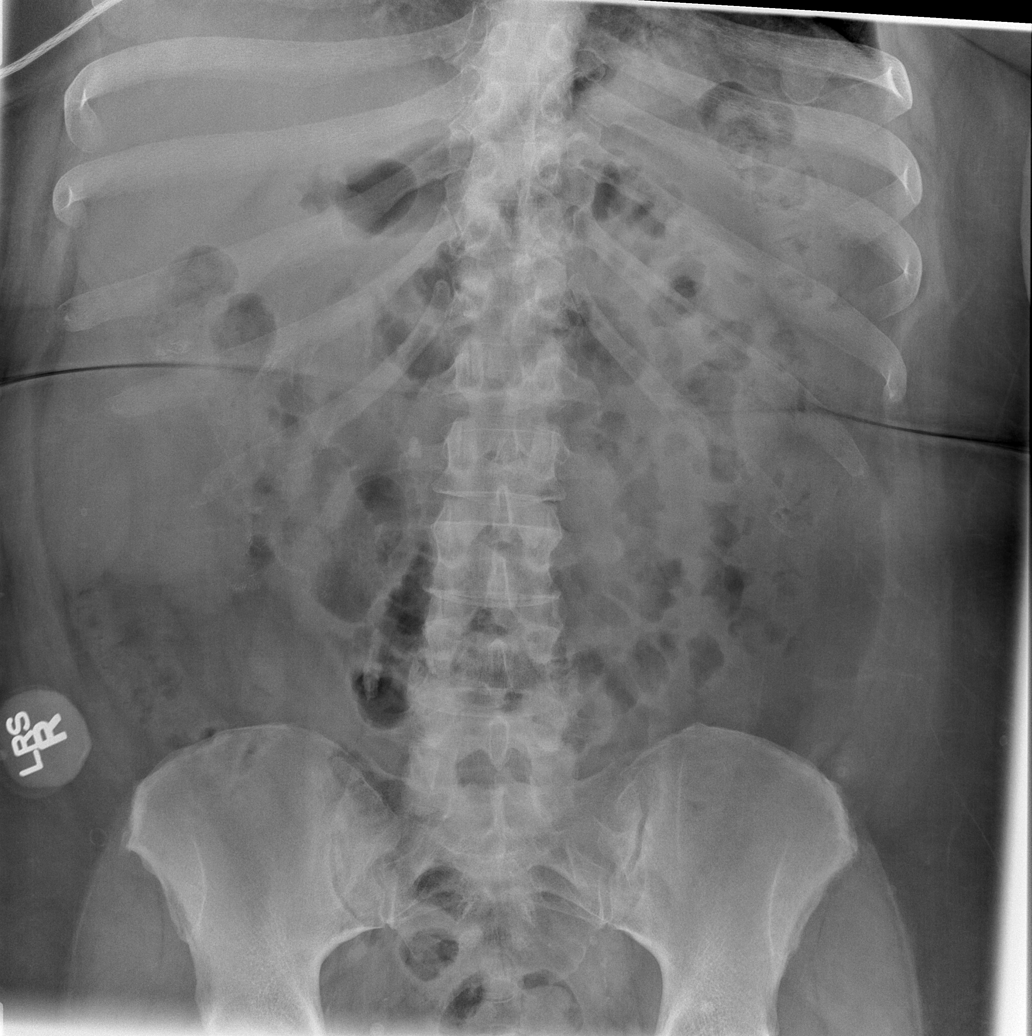

[4 of 4 positions shown; findings below may reference images not displayed]

FINDINGS: The cardiac silhouette appears upper limits of normal in size,
mildly calcified aortic knob.

. Lungs are clear, no pleural effusions. No pneumothorax. Soft
tissue planes and included osseous structures are unremarkable.

Bowel gas pattern is nondilated and nonobstructive. Surgical staple
anastomosis in the RIGHT upper quadrant. Subcentimeter
calcifications projecting over the RIGHT of the lumbar spine,
unchanged. No intra-abdominal mass effect, or free air. Phleboliths
in the pelvis. Soft tissue planes and included osseous structures
are nonsuspicious.
IMPRESSION: Borderline cardiomegaly, no acute pulmonary process.

Nonspecific bowel gas pattern. Stable sub cm projecting to the RIGHT
of the lumbar spine favor phleboliths within the gonadal vein.

  By: Osniel Hackney
